# Patient Record
Sex: Male | Born: 1955 | Race: White | Hispanic: No | Smoking: Never smoker
Health system: Southern US, Community
[De-identification: ages and names within clinical notes are randomized; demographics above are authoritative.]

## PROBLEM LIST (undated history)

## (undated) DIAGNOSIS — E785 Hyperlipidemia, unspecified: Secondary | ICD-10-CM

## (undated) DIAGNOSIS — I10 Essential (primary) hypertension: Secondary | ICD-10-CM

## (undated) DIAGNOSIS — H9319 Tinnitus, unspecified ear: Secondary | ICD-10-CM

## (undated) DIAGNOSIS — K219 Gastro-esophageal reflux disease without esophagitis: Secondary | ICD-10-CM

## (undated) DIAGNOSIS — Z8489 Family history of other specified conditions: Secondary | ICD-10-CM

## (undated) DIAGNOSIS — F32A Depression, unspecified: Secondary | ICD-10-CM

## (undated) DIAGNOSIS — I214 Non-ST elevation (NSTEMI) myocardial infarction: Secondary | ICD-10-CM

## (undated) DIAGNOSIS — F329 Major depressive disorder, single episode, unspecified: Secondary | ICD-10-CM

## (undated) DIAGNOSIS — S13101A Dislocation of unspecified cervical vertebrae, initial encounter: Secondary | ICD-10-CM

## (undated) DIAGNOSIS — Z86711 Personal history of pulmonary embolism: Secondary | ICD-10-CM

## (undated) DIAGNOSIS — N400 Enlarged prostate without lower urinary tract symptoms: Secondary | ICD-10-CM

## (undated) DIAGNOSIS — M1990 Unspecified osteoarthritis, unspecified site: Secondary | ICD-10-CM

## (undated) DIAGNOSIS — G4730 Sleep apnea, unspecified: Secondary | ICD-10-CM

## (undated) DIAGNOSIS — D689 Coagulation defect, unspecified: Secondary | ICD-10-CM

## (undated) HISTORY — PX: OTHER SURGICAL HISTORY: SHX169

## (undated) HISTORY — DX: Hyperlipidemia, unspecified: E78.5

## (undated) HISTORY — DX: Personal history of pulmonary embolism: Z86.711

## (undated) HISTORY — PX: COLONOSCOPY: SHX174

## (undated) HISTORY — PX: POLYPECTOMY: SHX149

## (undated) HISTORY — PX: WISDOM TOOTH EXTRACTION: SHX21

## (undated) HISTORY — DX: Coagulation defect, unspecified: D68.9

---
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
## 2010-11-07 ENCOUNTER — Encounter: Payer: Self-pay | Admitting: Internal Medicine

## 2010-11-15 NOTE — Letter (Signed)
Summary: Pre Visit Letter Revised  [facility]  [address] [place], [place] [address]   Phone: [phone]  Fax: [phone]        11/07/2010 MRN: [id number]  [facility] [address] [place], [place]  [address]             Procedure Date:  12-11-10 10:30am           Direct Colon - Dr Marina Goodell   Welcome to the Gastroenterology Division at [facility].    You are scheduled to see a nurse for your pre-procedure visit on 11-28-10 at 4pm on the 3rd floor at [facility], [address]  We ask that you try to arrive at our office 15 minutes prior to your appointment time to allow for check-in.  Please take a minute to review the attached form.  If you answer "Yes" to one or more of the questions on the first page, we ask that you call the person listed at your earliest opportunity.  If you answer "No" to all of the questions, please complete the rest of the form and bring it to your appointment.    Your nurse visit will consist of discussing your medical and surgical history, your immediate family medical history, and your medications.   If you are unable to list all of your medications on the form, please bring the medication bottles to your appointment and we will list them.  We will need to be aware of both prescribed and over the counter drugs.  We will need to know exact dosage information as well.    Please be prepared to read and sign documents such as consent forms, a financial agreement, and acknowledgement forms.  If necessary, and with your consent, a friend or relative is welcome to sit-in on the nurse visit with you.  Please bring your insurance card so that we may make a copy of it.  If your insurance requires a referral to see a specialist, please bring your referral form from your primary care physician.  No co-pay is required for this nurse visit.     If you cannot keep your appointment, please call [phone] to cancel or reschedule prior to  your appointment date.  This allows [place] the opportunity to schedule an appointment for another patient in need of care.    Thank you for choosing [facility] for your medical needs.  We appreciate the opportunity to care for you.  Please visit [place] at our website  to learn more about our practice.  Sincerely, The Gastroenterology Division

## 2010-11-26 ENCOUNTER — Encounter (INDEPENDENT_AMBULATORY_CARE_PROVIDER_SITE_OTHER): Payer: Self-pay | Admitting: *Deleted

## 2010-11-28 ENCOUNTER — Encounter: Payer: Self-pay | Admitting: Internal Medicine

## 2010-12-05 NOTE — Miscellaneous (Signed)
Summary: [facility] Previsit/prep  Clinical Lists Changes  Medications: Added new medication of MOVIPREP 100 GM  SOLR (PEG-KCL-NACL-NASULF-NA ASC-C) As per prep instructions. - Signed Rx of MOVIPREP 100 GM  SOLR (PEG-KCL-NACL-NASULF-NA ASC-C) As per prep instructions.;  #1 x 0;  Signed;  Entered by: Wyona Almas RN;  Authorized by: Hilarie Fredrickson MD;  Method used: Electron[phone] [address] [place], [place], [place]  [address], Ph: [phone] or [phone], Fax: [phone] Observations: Added new observation of NKA: T (11/28/2010 16:00)    Prescriptions: MOVIPREP 100 GM  SOLR (PEG-KCL-NACL-NASULF-NA ASC-C) As per prep instructions.  #1 x 0   Entered by:   Wyona Almas RN   Authorized by:   Hilarie Fredrickson MD   Signed by:   Wyona Almas RN on 11/28/2010   Method used:   Electronically to        [facility] [phone][facility] (retail)       [address]       [place], [place]  [address]       Ph: [phone] or [phone]       Fax: [phone]   RxID:   [id number]

## 2010-12-05 NOTE — Letter (Signed)
Summary: [facility] Instructions  [facility]  [address] [place], [place] [address]   Phone: [phone]  Fax: [phone]       Jonathan Rios    18-Oct-1955    MRN: [id number]        Procedure Day /Date: Monday 12-17-10      Arrival Time: 3:00 pm     Procedure Time: 4:00 pm     Location of Procedure:                    _x _  [facility] (4th Floor)                        PREPARATION FOR COLONOSCOPY WITH MOVIPREP   Starting 5 days prior to your procedure  12-12-10 do not eat nuts, seeds, popcorn, corn, beans, peas,  salads, or any raw vegetables.  Do not take any fiber supplements (e.g. Metamucil, Citrucel, and Benefiber).  THE DAY BEFORE YOUR PROCEDURE         DATE:  12-16-10  DAY: Sunday   1.  Drink clear liquids the entire day-NO SOLID FOOD  2.  Do not drink anything colored red or purple.  Avoid juices with pulp.  No orange juice.  3.  Drink at least 64 oz. (8 glasses) of fluid/clear liquids during the day to prevent dehydration and help the prep work efficiently.  CLEAR LIQUIDS INCLUDE: Water Jello Ice Popsicles Tea (sugar ok, no milk/cream) Powdered fruit flavored drinks Coffee (sugar ok, no milk/cream) Gatorade Juice: apple, white grape, white cranberry  Lemonade Clear bullion, consomm, broth Carbonated beverages (any kind) Strained chicken noodle soup Hard Candy                             4.  In the morning, mix first dose of MoviPrep solution:    Empty 1 Pouch A and 1 Pouch B into the disposable container    Add lukewarm drinking water to the top line of the container. Mix to dissolve    Refrigerate (mixed solution should be used within 24 hrs)  5.  Begin drinking the prep at 5:00 p.m. The MoviPrep container is divided by 4 marks.   Every 15 minutes drink the solution down to the next mark (approximately 8 oz) until the full liter is complete.   6.  Follow completed prep with 16 oz of clear liquid of your choice  (Nothing red or purple).  Continue to drink clear liquids until bedtime.  7.  Before going to bed, mix second dose of MoviPrep solution:    Empty 1 Pouch A and 1 Pouch B into the disposable container    Add lukewarm drinking water to the top line of the container. Mix to dissolve    Refrigerate  THE DAY OF YOUR PROCEDURE      DATE:  12-17-10  DAY: Monday  Beginning at  11:00 a.m. (5 hours before procedure):         1. Every 15 minutes, drink the solution down to the next mark (approx 8 oz) until the full liter is complete.  2. Follow completed prep with 16 oz. of clear liquid of your choice.    3. You may drink clear liquids until  2:00 p.m. (2 HOURS BEFORE PROCEDURE).   MEDICATION INSTRUCTIONS  Unless otherwise instructed, you should take regular prescription medications with a small sip of water  as early as possible the morning of your procedure.       OTHER INSTRUCTIONS  You will need a responsible adult at least 54 years of age to accompany you and drive you home.   This person must remain in the waiting room during your procedure.  Wear loose fitting clothing that is easily removed.  Leave jewelry and other valuables at home.  However, you may wish to bring a book to read or  an iPod/MP3 player to listen to music as you wait for your procedure to start.  Remove all body piercing jewelry and leave at home.  Total time from sign-in until discharge is approximately 2-3 hours.  You should go home directly after your procedure and rest.  You can resume normal activities the  day after your procedure.  The day of your procedure you should not:   Drive   Make legal decisions   Operate machinery   Drink alcohol   Return to work  You will receive specific instructions about eating, activities and medications before you leave.    The above instructions have been reviewed and explained to me by  Wyona Almas RN  November 28, 2010 4:35 PM     I fully  understand and can verbalize these instructions _____________________________ Date _________

## 2010-12-11 ENCOUNTER — Other Ambulatory Visit: Payer: Self-pay | Admitting: Internal Medicine

## 2010-12-17 ENCOUNTER — Other Ambulatory Visit: Payer: Self-pay | Admitting: Internal Medicine

## 2010-12-17 ENCOUNTER — Other Ambulatory Visit (AMBULATORY_SURGERY_CENTER): Payer: Managed Care, Other (non HMO) | Admitting: Internal Medicine

## 2010-12-17 DIAGNOSIS — D126 Benign neoplasm of colon, unspecified: Secondary | ICD-10-CM

## 2010-12-17 DIAGNOSIS — Z1211 Encounter for screening for malignant neoplasm of colon: Secondary | ICD-10-CM

## 2010-12-25 ENCOUNTER — Telehealth: Payer: Self-pay | Admitting: Internal Medicine

## 2010-12-25 NOTE — Procedures (Addendum)
Summary: Colonoscopy  Patient: [name] Note: All result statuses are Final unless otherwise noted.  Tests: (1) Colonoscopy (COL)   COL Colonoscopy           DONE     [facility]     [address]     [place], [place]  [address]          COLONOSCOPY PROCEDURE REPORT          PATIENT:  Jonathan Rios, Jonathan Rios  MR#:  [id number]     BIRTHDATE:  1955-11-10, 54 yrs. old  GENDER:  male     ENDOSCOPIST:  Wilhemina Bonito. Eda Keys, MD     REF. BY:  .Direct/ Dr. Gonzella Lex     PROCEDURE DATE:  12/17/2010     PROCEDURE:  Colonoscopy with snare polypectomy x 1     ASA CLASS:  Class I     INDICATIONS:  Routine Risk Screening     MEDICATIONS:   Fentanyl 100 mcg IV, Versed 10 mg IV          DESCRIPTION OF PROCEDURE:   After the risks benefits and     alternatives of the procedure were thoroughly explained, informed     consent was obtained.  Digital rectal exam was performed and     revealed no abnormalities.   The LB 180AL [id number] endoscope was     introduced through the anus and advanced to the cecum, which was     identified by both the appendix and ileocecal valve, without     limitations.Time to cecum = 2:45 min.  The quality of the prep was     good, using MoviPrep.  The instrument was then slowly withdrawn     (time =11:41 min) as the colon was fully examined.     <<PROCEDUREIMAGES>>          FINDINGS:  A 9mm sessile polyp was found in the ascending colon.     Polyp was snared without cautery. Retrieval was successful.     Otherwise normal colonoscopy without other polyps, masses, vascular     ectasias, or inflammatory changes.   Retroflexed views in the     rectum revealed no abnormalities.    The scope was then withdrawn     from the patient and the procedure completed.          COMPLICATIONS:  None          ENDOSCOPIC IMPRESSION:     1) Sessile polyp in the ascending colon - removed     2) Otherwise normal colonoscopy          RECOMMENDATIONS:     1) Follow up  colonoscopy in 5 years          ______________________________     Wilhemina Bonito. Eda Keys, MD          CC:  The Patient; Charna Archer, MD          n.     eSIGNED:   Wilhemina Bonito. Eda Keys at 12/17/2010 05:03 PM          Mertie Clause, [id number]  Note: An exclamation mark (!) indicates a result that was not dispersed into the flowsheet. Document Creation Date: 12/17/2010 5:03 PM _______________________________________________________________________  (1) Order result status: Final Collection or observation date-time: 12/17/2010 16:55 Requested date-time:  Receipt date-time:  Reported date-time:  Referring Physician:   Ordering Physician: Fransico Setters [phone]) Specimen Source:  Source: Launa Grill Order Number: [id number]  Lab site:   Appended Document: Colonoscopy     Procedures Next Due Date:    Colonoscopy: 12/2015

## 2010-12-29 ENCOUNTER — Encounter: Payer: Self-pay | Admitting: Internal Medicine

## 2011-01-01 NOTE — Letter (Addendum)
Summary: Patient Notice- Polyp Results  [facility]  [address] [place], [place] [address]   Phone: [phone]  Fax: [phone]        December 29, 2010 MRN: [id number]    [facility] [address] [place], [place]  [address]    Dear Mr. Cali,  I am pleased to inform you that the colon polyp(s) removed during your recent colonoscopy was (were) found to be benign (no cancer detected) upon pathologic examination.  I recommend you have a repeat colonoscopy examination in 5 years to look for recurrent polyps, as having colon polyps increases your risk for having recurrent polyps or even colon cancer in the future.  Should you develop new or worsening symptoms of abdominal pain, bowel habit changes or bleeding from the rectum or bowels, please schedule an evaluation with either your primary care physician or with me.  Additional information/recommendations:  __ No further action with gastroenterology is needed at this time. Please      follow-up with your primary care physician for your other healthcare      needs.   Please call us if you are having persistent problems or have questions about your condition that have not been fully answered at this time.  Sincerely,  Hilarie Fredrickson MD  This letter has been electronically signed by your physician.  Appended Document: Patient Notice- Polyp Results letter mailed

## 2011-01-01 NOTE — Progress Notes (Signed)
Summary: speak with nurse  Phone Note Call from Patient Call back at [phone]   Caller: Patient Call For: Dr. Marina Goodell Reason for Call: Talk to Nurse Summary of Call: Patient is calling about getting a copy of his colon report, he spoke with Medical records but they told him he had to speak with Dr. Broadus John nurse to get a copy so that is what he wants to do Initial call taken by: [place] Johnson,  December 25, 2010 10:15 AM  Follow-up for Phone Call        Colon report mailed to patient today as per his request. Follow-up by: Selinda Michaels RN,  December 25, 2010 10:43 AM

## 2015-02-01 ENCOUNTER — Inpatient Hospital Stay (HOSPITAL_COMMUNITY): Payer: Managed Care, Other (non HMO)

## 2015-02-01 ENCOUNTER — Encounter (HOSPITAL_COMMUNITY): Payer: Self-pay

## 2015-02-01 ENCOUNTER — Emergency Department (HOSPITAL_COMMUNITY): Payer: Managed Care, Other (non HMO)

## 2015-02-01 ENCOUNTER — Inpatient Hospital Stay (HOSPITAL_COMMUNITY)
Admission: EM | Admit: 2015-02-01 | Discharge: 2015-02-06 | DRG: 176 | Disposition: A | Discharge status: Home / Self Care | Payer: Managed Care, Other (non HMO) | Attending: Internal Medicine | Admitting: Internal Medicine

## 2015-02-01 DIAGNOSIS — I82432 Acute embolism and thrombosis of left popliteal vein: Secondary | ICD-10-CM | POA: Diagnosis present

## 2015-02-01 DIAGNOSIS — I82491 Acute embolism and thrombosis of other specified deep vein of right lower extremity: Secondary | ICD-10-CM | POA: Diagnosis present

## 2015-02-01 DIAGNOSIS — I82812 Embolism and thrombosis of superficial veins of left lower extremities: Secondary | ICD-10-CM | POA: Diagnosis present

## 2015-02-01 DIAGNOSIS — I951 Orthostatic hypotension: Secondary | ICD-10-CM | POA: Diagnosis present

## 2015-02-01 DIAGNOSIS — Z8249 Family history of ischemic heart disease and other diseases of the circulatory system: Secondary | ICD-10-CM

## 2015-02-01 DIAGNOSIS — I2699 Other pulmonary embolism without acute cor pulmonale: Secondary | ICD-10-CM | POA: Diagnosis not present

## 2015-02-01 DIAGNOSIS — R0602 Shortness of breath: Secondary | ICD-10-CM

## 2015-02-01 DIAGNOSIS — I82403 Acute embolism and thrombosis of unspecified deep veins of lower extremity, bilateral: Secondary | ICD-10-CM | POA: Diagnosis not present

## 2015-02-01 DIAGNOSIS — I214 Non-ST elevation (NSTEMI) myocardial infarction: Secondary | ICD-10-CM

## 2015-02-01 DIAGNOSIS — F329 Major depressive disorder, single episode, unspecified: Secondary | ICD-10-CM | POA: Diagnosis present

## 2015-02-01 DIAGNOSIS — R0902 Hypoxemia: Secondary | ICD-10-CM | POA: Diagnosis present

## 2015-02-01 DIAGNOSIS — R0789 Other chest pain: Secondary | ICD-10-CM | POA: Diagnosis present

## 2015-02-01 DIAGNOSIS — I82409 Acute embolism and thrombosis of unspecified deep veins of unspecified lower extremity: Secondary | ICD-10-CM | POA: Insufficient documentation

## 2015-02-01 DIAGNOSIS — I10 Essential (primary) hypertension: Secondary | ICD-10-CM | POA: Diagnosis present

## 2015-02-01 DIAGNOSIS — R079 Chest pain, unspecified: Secondary | ICD-10-CM | POA: Diagnosis present

## 2015-02-01 DIAGNOSIS — I9589 Other hypotension: Secondary | ICD-10-CM | POA: Diagnosis not present

## 2015-02-01 HISTORY — DX: Unspecified osteoarthritis, unspecified site: M19.90

## 2015-02-01 HISTORY — DX: Sleep apnea, unspecified: G47.30

## 2015-02-01 HISTORY — DX: Non-ST elevation (NSTEMI) myocardial infarction: I21.4

## 2015-02-01 HISTORY — DX: Gastro-esophageal reflux disease without esophagitis: K21.9

## 2015-02-01 HISTORY — DX: Essential (primary) hypertension: I10

## 2015-02-01 HISTORY — DX: Depression, unspecified: F32.A

## 2015-02-01 HISTORY — DX: Dislocation of unspecified cervical vertebrae, initial encounter: S13.101A

## 2015-02-01 HISTORY — DX: Family history of other specified conditions: Z84.89

## 2015-02-01 HISTORY — DX: Tinnitus, unspecified ear: H93.19

## 2015-02-01 HISTORY — DX: Benign prostatic hyperplasia without lower urinary tract symptoms: N40.0

## 2015-02-01 HISTORY — DX: Major depressive disorder, single episode, unspecified: F32.9

## 2015-02-01 LAB — BLOOD GAS, ARTERIAL
Acid-base deficit: 6.2 mmol/L — ABNORMAL HIGH (ref 0.0–2.0)
BICARBONATE: 17 meq/L — AB (ref 20.0–24.0)
Drawn by: 36989
O2 Content: 4 L/min
O2 Saturation: 95.5 %
PATIENT TEMPERATURE: 98.6
PCO2 ART: 24.7 mmHg — AB (ref 35.0–45.0)
PH ART: 7.453 — AB (ref 7.350–7.450)
TCO2: 17.8 mmol/L (ref 0–100)
pO2, Arterial: 71.6 mmHg — ABNORMAL LOW (ref 80.0–100.0)

## 2015-02-01 LAB — TROPONIN I: TROPONIN I: 0.25 ng/mL — AB (ref <0.031)

## 2015-02-01 LAB — BASIC METABOLIC PANEL
Anion gap: 10 (ref 5–15)
BUN: 19 mg/dL (ref 6–23)
CHLORIDE: 106 mmol/L (ref 96–112)
CO2: 21 mmol/L (ref 19–32)
Calcium: 9.1 mg/dL (ref 8.4–10.5)
Creatinine, Ser: 1.21 mg/dL (ref 0.50–1.35)
GFR calc Af Amer: 75 mL/min — ABNORMAL LOW (ref >90)
GFR calc non Af Amer: 64 mL/min — ABNORMAL LOW (ref >90)
GLUCOSE: 167 mg/dL — AB (ref 70–99)
POTASSIUM: 4.8 mmol/L (ref 3.5–5.1)
Sodium: 137 mmol/L (ref 135–145)

## 2015-02-01 LAB — APTT: APTT: 28 s (ref 24–37)

## 2015-02-01 LAB — CBC
HEMATOCRIT: 50.6 % (ref 39.0–52.0)
Hemoglobin: 17.1 g/dL — ABNORMAL HIGH (ref 13.0–17.0)
MCH: 30.2 pg (ref 26.0–34.0)
MCHC: 33.8 g/dL (ref 30.0–36.0)
MCV: 89.4 fL (ref 78.0–100.0)
Platelets: 224 10*3/uL (ref 150–400)
RBC: 5.66 MIL/uL (ref 4.22–5.81)
RDW: 13.3 % (ref 11.5–15.5)
WBC: 17.3 10*3/uL — AB (ref 4.0–10.5)

## 2015-02-01 LAB — I-STAT TROPONIN, ED: TROPONIN I, POC: 0.13 ng/mL — AB (ref 0.00–0.08)

## 2015-02-01 LAB — PROTIME-INR
INR: 1.17 (ref 0.00–1.49)
Prothrombin Time: 15.1 seconds (ref 11.6–15.2)

## 2015-02-01 LAB — HEPARIN LEVEL (UNFRACTIONATED): HEPARIN UNFRACTIONATED: 0.3 [IU]/mL (ref 0.30–0.70)

## 2015-02-01 MED ORDER — ONDANSETRON HCL 4 MG/2ML IJ SOLN: 4.0000 mg | Freq: Four times a day (QID) | INTRAMUSCULAR | Status: DC | PRN

## 2015-02-01 MED ORDER — ASPIRIN EC 81 MG PO TBEC: 81.0000 mg | DELAYED_RELEASE_TABLET | Freq: Every day | ORAL | Status: DC

## 2015-02-01 MED ORDER — ASPIRIN 81 MG PO CHEW
324.0000 mg | CHEWABLE_TABLET | ORAL | Status: DC
Start: 2015-02-01 — End: 2015-02-02

## 2015-02-01 MED ORDER — SODIUM CHLORIDE 0.9 % IJ SOLN: 3.0000 mL | INTRAMUSCULAR | Status: DC | PRN

## 2015-02-01 MED ORDER — ALPRAZOLAM 0.25 MG PO TABS: 0.2500 mg | ORAL_TABLET | Freq: Two times a day (BID) | ORAL | Status: DC | PRN

## 2015-02-01 MED ORDER — ASPIRIN 81 MG PO CHEW
81.0000 mg | CHEWABLE_TABLET | ORAL | Status: AC
  Administered 2015-02-02: 81 mg via ORAL
  Filled 2015-02-01: qty 1

## 2015-02-01 MED ORDER — BUPROPION HCL 100 MG PO TABS
100.0000 mg | ORAL_TABLET | Freq: Three times a day (TID) | ORAL | Status: DC
  Administered 2015-02-02 – 2015-02-06 (×12): 100 mg via ORAL
  Filled 2015-02-01 (×18): qty 1

## 2015-02-01 MED ORDER — SODIUM CHLORIDE 0.9 % IJ SOLN
3.0000 mL | Freq: Two times a day (BID) | INTRAMUSCULAR | Status: DC
  Administered 2015-02-02 – 2015-02-05 (×5): 3 mL via INTRAVENOUS

## 2015-02-01 MED ORDER — SODIUM CHLORIDE 0.9 % IV SOLN
250.0000 mL | INTRAVENOUS | Status: DC | PRN
Start: 2015-02-01 — End: 2015-02-06

## 2015-02-01 MED ORDER — ASPIRIN 300 MG RE SUPP: 300.0000 mg | RECTAL | Status: DC

## 2015-02-01 MED ORDER — SODIUM CHLORIDE 0.9 % IV SOLN: 250.0000 mL | INTRAVENOUS | Status: DC | PRN

## 2015-02-01 MED ORDER — ASPIRIN 81 MG PO CHEW
324.0000 mg | CHEWABLE_TABLET | Freq: Once | ORAL | Status: AC
  Administered 2015-02-01: 324 mg via ORAL
  Filled 2015-02-01: qty 4

## 2015-02-01 MED ORDER — NITROGLYCERIN 0.4 MG SL SUBL
0.4000 mg | SUBLINGUAL_TABLET | SUBLINGUAL | Status: AC | PRN
  Administered 2015-02-01 (×3): 0.4 mg via SUBLINGUAL
  Filled 2015-02-01: qty 1

## 2015-02-01 MED ORDER — HEPARIN (PORCINE) IN NACL 100-0.45 UNIT/ML-% IJ SOLN
1300.0000 [IU]/h | INTRAMUSCULAR | Status: DC
  Administered 2015-02-01: 1300 [IU]/h via INTRAVENOUS
  Filled 2015-02-01: qty 250

## 2015-02-01 MED ORDER — ACETAMINOPHEN 325 MG PO TABS: 650.0000 mg | ORAL_TABLET | ORAL | Status: DC | PRN

## 2015-02-01 MED ORDER — NITROGLYCERIN 0.4 MG SL SUBL: 0.4000 mg | SUBLINGUAL_TABLET | SUBLINGUAL | Status: DC | PRN

## 2015-02-01 MED ORDER — LISINOPRIL 10 MG PO TABS: 10.0000 mg | ORAL_TABLET | Freq: Every day | ORAL | Status: DC

## 2015-02-01 MED ORDER — SODIUM CHLORIDE 0.9 % IJ SOLN: 3.0000 mL | Freq: Two times a day (BID) | INTRAMUSCULAR | Status: DC

## 2015-02-01 MED ORDER — HEPARIN BOLUS VIA INFUSION
4000.0000 [IU] | Freq: Once | INTRAVENOUS | Status: AC
  Administered 2015-02-01: 4000 [IU] via INTRAVENOUS
  Filled 2015-02-01: qty 4000

## 2015-02-01 MED ORDER — SODIUM CHLORIDE 0.9 % IV SOLN
1.0000 mL/kg/h | INTRAVENOUS | Status: DC
  Administered 2015-02-01 – 2015-02-02 (×2): 1 mL/kg/h via INTRAVENOUS

## 2015-02-01 MED ORDER — ZOLPIDEM TARTRATE 5 MG PO TABS: 5.0000 mg | ORAL_TABLET | Freq: Every evening | ORAL | Status: DC | PRN

## 2015-02-01 NOTE — ED Notes (Signed)
Pt states felt chest tightness last night prior to bed. Today has felt fatigued and dizzy.  Recently started lisinopril.  At work bp was 96 systolic.  HR at work was 105.  Pt denies cough.

## 2015-02-01 NOTE — Progress Notes (Signed)
ANTICOAGULATION CONSULT NOTE - Initial Consult  Pharmacy Consult for Heparin Indication: chest pain/ACS  No Known Allergies  Patient Measurements: Height: 6\' 2"  (188 cm) Weight: 215 lb (97.523 kg) IBW/kg (Calculated) : 82.2 Heparin Dosing Weight: actual weight  Vital Signs: Temp: 97.9 F (36.6 C) (04/20 1151) Temp Source: Oral (04/20 1151) BP: 110/75 mmHg (04/20 1357) Pulse Rate: 107 (04/20 1357)  Labs:  Recent Labs  02/01/15 1201 02/01/15 1346  HGB 17.1*  --   HCT 50.6  --   PLT 224  --   CREATININE 1.21  --   TROPONINI  --  0.25*    CrCl cannot be calculated (Unknown ideal weight.).   Medical History: Past Medical History  Diagnosis Date  . Hypertension   . Depression     Medications:  Infusions:  . heparin 1,300 Units/hr (02/01/15 1551)    Assessment: 89 yoM presenting to ED on 4/20 with chest pain and NSTEMI.  Initial troponin is elevated.  Pharmacy is consulted to dose Heparin infusion.   Baseline coags: pending collection  CBC: Hgb 17.1, Plt 224  Troponin: 0.25  SCr 1.21, CrCl ~ 77 ml/min   Goal of Therapy:  Heparin level 0.3-0.7 units/ml Monitor platelets by anticoagulation protocol: Yes   Plan:   Baseline PTT, PT/INR  Give heparin 4000 units bolus IV x 1  Start heparin IV infusion at 1300 units/hr  Heparin level 6 hours after starting  Daily heparin level and CBC  Continue to monitor H&H and platelets   Gretta Arab PharmD, BCPS Pager [phone] 02/01/2015 3:16 PM

## 2015-02-01 NOTE — ED Provider Notes (Signed)
CSN: [id number]     Arrival date & time 02/01/15  1137 History   First MD Initiated Contact with Patient 02/01/15 1327     Chief Complaint  Patient presents with  . Chest Pain    Patient is a 59 y.o. male presenting with chest pain. The history is provided by the patient and a relative.  Chest Pain Pain location:  Substernal area Pain quality: tightness   Pain radiates to:  Does not radiate Pain severity:  Moderate Onset quality:  Gradual Duration: over 12 hours. Timing:  Constant Progression:  Worsening Chronicity:  New Relieved by:  None tried Worsened by:  Nothing tried Associated symptoms: cough, near-syncope and shortness of breath   Associated symptoms: no abdominal pain, no fever and not vomiting   Pt presents with chest tightness that started last night and has continued He reports feeling SOB He reports feeling lightheaded upon standing and had episode of diaphoresis as well He recently started lisinopril for HTN He denies h/o CAD Denies sharp/pleuritic CP   Family history - negative for CAD Past Medical History  Diagnosis Date  . Hypertension   . Depression    Past Surgical History  Procedure Laterality Date  . Angioma      History reviewed. No pertinent family history. History  Substance Use Topics  . Smoking status: Never Smoker   . Smokeless tobacco: Not on file  . Alcohol Use: No    Review of Systems  Constitutional: Negative for fever.  Respiratory: Positive for cough and shortness of breath.   Cardiovascular: Positive for chest pain and near-syncope.  Gastrointestinal: Negative for vomiting and abdominal pain.  Neurological: Positive for light-headedness.  All other systems reviewed and are negative.     Allergies  Review of patient's allergies indicates no known allergies.  Home Medications   Prior to Admission medications   Medication Sig Start Date End Date Taking? Authorizing Provider  buPROPion (WELLBUTRIN) 100 MG tablet Take 100  mg by mouth 3 (three) times daily.  01/24/15  Yes Historical Provider, MD  ibuprofen (ADVIL,MOTRIN) 200 MG tablet Take 600 mg by mouth every 6 (six) hours as needed (for pain).   Yes Historical Provider, MD  lisinopril (PRINIVIL,ZESTRIL) 10 MG tablet Take 10 mg by mouth at bedtime.  01/24/15  Yes Historical Provider, MD   BP 110/75 mmHg  Pulse 107  Temp(Src) 97.9 F (36.6 C) (Oral)  Resp 21  SpO2 94% Physical Exam CONSTITUTIONAL: Well developed/well nourished HEAD: Normocephalic/atraumatic EYES: EOMI/PERRL ENMT: Mucous membranes moist NECK: supple no meningeal signs SPINE/BACK:entire spine nontender CV: S1/S2 noted, no murmurs/rubs/gallops noted LUNGS: Lungs are clear to auscultation bilaterally, no apparent distress ABDOMEN: soft, nontender, no rebound or guarding, bowel sounds noted throughout abdomen GU:no cva tenderness NEURO: Pt is awake/alert/appropriate, moves all extremitiesx4.  No facial droop.   EXTREMITIES: pulses normal/equal, full ROM, no LE edema noted SKIN: warm, color normal PSYCH: no abnormalities of mood noted, alert and oriented to situation  ED Course  Procedures  CRITICAL CARE Performed by: Sharyon Cable Total critical care time: 32 Critical care time was exclusive of separately billable procedures and treating other patients. Critical care was necessary to treat or prevent imminent or life-threatening deterioration. Critical care was time spent personally by me on the following activities: development of treatment plan with patient and/or surrogate as well as nursing, discussions with consultants, evaluation of patient's response to treatment, examination of patient, obtaining history from patient or surrogate, ordering and performing treatments and interventions, ordering and  review of laboratory studies, ordering and review of radiographic studies, pulse oximetry and re-evaluation of patient's condition. PATIENT WITH NON-STEMI THAT REQUIRES ADMISSION TO  CARDIOLOGY  Labs Review Labs Reviewed  CBC - Abnormal; Notable for the following:    WBC 17.3 (*)    Hemoglobin 17.1 (*)    All other components within normal limits  BASIC METABOLIC PANEL - Abnormal; Notable for the following:    Glucose, Bld 167 (*)    GFR calc non Af Amer 64 (*)    GFR calc Af Amer 75 (*)    All other components within normal limits  TROPONIN I - Abnormal; Notable for the following:    Troponin I 0.25 (*)    All other components within normal limits  I-STAT TROPOININ, ED - Abnormal; Notable for the following:    Troponin i, poc 0.13 (*)    All other components within normal limits  APTT  PROTIME-INR    Imaging Review Dg Chest Portable 1 View  02/01/2015   CLINICAL DATA:  Chest pain and short of breath  EXAM: PORTABLE CHEST - 1 VIEW  COMPARISON:  None.  FINDINGS: The heart size and mediastinal contours are within normal limits. Both lungs are clear. The visualized skeletal structures are unremarkable.  IMPRESSION: No active disease.   Electronically Signed   By: Franchot Gallo M.D.   On: 02/01/2015 14:30     EKG Interpretation   Date/Time:  Wednesday February 01 2015 11:47:26 EDT Ventricular Rate:  107 PR Interval:  159 QRS Duration: 88 QT Interval:  337 QTC Calculation: 450 R Axis:   98 Text Interpretation:  Sinus tachycardia Biatrial enlargement Borderline  right axis deviation No previous ECGs available Abnormal ekg Confirmed by  Christy Gentles  MD, Elenore Rota ([id number]) on 02/01/2015 12:36:39 PM     Medications  heparin bolus via infusion 4,000 Units (not administered)  heparin ADULT infusion 100 units/mL (25000 units/250 mL) (not administered)  aspirin chewable tablet 324 mg (324 mg Oral Given 02/01/15 1353)  nitroGLYCERIN (NITROSTAT) SL tablet 0.4 mg (0.4 mg Sublingual Given 02/01/15 1529)    3:41 PM PT FOUND TO HAVE NONSTEMI PT WITH CHEST TIGHTNESS AND NEAR SYNCOPE WILL ADMIT TO CARDIOLOGY D/W [facility] WILL ADMIT TO SDU AT [facility] PT DENIES RECENT  GI BLEED, HEPARIN ORDERED DOUBT PE AS CAUSE (DENIES PLEURITIC CP) BP 86/58 mmHg  Pulse 95  Temp(Src) 97.9 F (36.6 C) (Oral)  Resp 21  Ht 6\' 2"  (1.88 m)  Wt 215 lb (97.523 kg)  BMI 27.59 kg/m2  SpO2 93%   MDM   Final diagnoses:  Non-STEMI (non-ST elevated myocardial infarction)    Nursing notes including past medical history and social history reviewed and considered in documentation Labs/vital reviewed myself and considered during evaluation xrays/imaging reviewed by myself and considered during evaluation      Ripley Fraise, MD 02/01/15 1542

## 2015-02-01 NOTE — Progress Notes (Addendum)
Brief X-Cover Note  Jonathan Rios was in the bathroom when he felt dizziness and gradually slumped into the floor. He did not overtly pass out but felt very lightheaded. He has felt similarly a few times since his symptoms started overnight last night leading to presentation. He has an elevated troponin with plans for cardiac catheterization in the morning. Telemetry revealed sinus tachycardia. Repeat ECG largely unchanged with nonspecific ST changes to borderline ST depression, sinus tachycardia, incomplete RBBB. He is mildly hypotensive 80s/40s and we are giving him 1L fluid bolus and gradually transitioning him back to bed. Redraw troponins, early repeat lab draw, low threshold to activate cath lab based on any changes in symptoms. I discussed the plan with Jonathan Rios as well.   Jules Husbands, MD  Addendum: He felt improved and rested well afterwards. Troponin increased slightly to 0.49. D-Dimer returned positive but difficult to interpret in setting of NSTEMI. Wbc also increasing. I have added on a urinalysis. Anticipate heart catheterization today. If negative, may need to pursue alternative causes.   Jules Husbands, MD

## 2015-02-01 NOTE — ED Notes (Signed)
Troponin given to Dr Aline Brochure.Marland Kitchenklj

## 2015-02-01 NOTE — H&P (Signed)
CARDIOLOGY ADMISSION NOTE  Patient ID: Jonathan Rios MRN: [id number] DOB/AGE: May 18, 1956 59 y.o.  Admit date: 02/01/2015 Primary Physician   Orpah Melter, MD Primary Cardiologist   None Chief Complaint    Chest pain  HPI:  The patient presented with chest pain that started this AM.  He has no prior cardiac and very little medical history. He was recently started on medications for depression and hypertension. This morning he was in bed and he developed some chest discomfort. It was substernal. It was moderate in intensity. When he got up to walk around he was very lightheaded. 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 However, he was very fatigued and wobbly at work. He was continuing to have the discomfort. He was urged to come to the emergency room and was driven by his daughter. He has had nitroglycerin sublingual and is currently pain-free. He's on heparin and has had aspirin. He otherwise had been feeling somewhat more fatigued in the last weeks. However, he is able to do yard work. With this he's not been having any symptoms.  The patient denies any new symptoms such as chest discomfort, neck or arm discomfort. There has been no new shortness of breath, PND or orthopnea. There have been no reported palpitations, presyncope or syncope. Of note in the emergency room his EKG was nonacute. He does have elevated troponin 1 at 0.25. His WBCs are elevated. His glucose was also elevated.    Past Medical History  Diagnosis Date  . Hypertension   . Depression     Past Surgical History  Procedure Laterality Date  . Angioma       No Known Allergies   Prior to Admission medications   Medication Sig Start Date End Date Taking? Authorizing Provider  buPROPion (WELLBUTRIN) 100 MG tablet Take 100 mg by mouth 3 (three) times daily.  01/24/15  Yes Historical Provider, MD  ibuprofen (ADVIL,MOTRIN) 200 MG tablet Take 600 mg by mouth every 6 (six) hours as needed (for pain).   Yes Historical  Provider, MD  lisinopril (PRINIVIL,ZESTRIL) 10 MG tablet Take 10 mg by mouth at bedtime.  01/24/15  Yes Historical Provider, MD    History   Social History  . Marital Status: Single    Spouse Name: N/A  . Number of Children: N/A  . Years of Education: N/A   Occupational History  . Not on file.   Social History Main Topics  . Smoking status: Never Smoker   . Smokeless tobacco: Not on file  . Alcohol Use: No  . Drug Use: No  . Sexual Activity: Not on file   Other Topics Concern  . Not on file   Social History Narrative  . No narrative on file    History reviewed. No pertinent family history.   ROS:  As stated in the HPI and negative for all other systems.  Physical Exam: Blood pressure 106/84, pulse 102, temperature 97.9 F (36.6 C), temperature source Oral, resp. rate 25, height 6\' 2"  (1.88 m), weight 215 lb (97.523 kg), SpO2 92 %.  GENERAL:  Well appearing HEENT:  Pupils equal round and reactive, fundi not visualized, oral mucosa unremarkable NECK:  No jugular venous distention, waveform within normal limits, carotid upstroke brisk and symmetric, no bruits, no thyromegaly LYMPHATICS:  No cervical, inguinal adenopathy LUNGS:  Clear to auscultation bilaterally BACK:  No CVA tenderness CHEST:  Unremarkable HEART:  PMI not displaced or sustained,S1 and S2 within normal limits, no S3, no S4, no  clicks, no rubs, no murmurs ABD:  Flat, positive bowel sounds normal in frequency in pitch, no bruits, no rebound, no guarding, no midline pulsatile mass, no hepatomegaly, no splenomegaly EXT:  2 plus pulses throughout, no edema, no cyanosis no clubbing SKIN:  No rashes no nodules NEURO:  Cranial nerves II through XII grossly intact, motor grossly intact throughout PSYCH:  Cognitively intact, oriented to person place and time  Labs: Lab Results  Component Value Date   BUN 19 02/01/2015   Lab Results  Component Value Date   CREATININE 1.21 02/01/2015   Lab Results  Component  Value Date   NA 137 02/01/2015   K 4.8 02/01/2015   CL 106 02/01/2015   CO2 21 02/01/2015   Lab Results  Component Value Date   TROPONINI 0.25* 02/01/2015   Lab Results  Component Value Date   WBC 17.3* 02/01/2015   HGB 17.1* 02/01/2015   HCT 50.6 02/01/2015   MCV 89.4 02/01/2015   PLT 224 02/01/2015     Radiology:  CXR:  The heart size and mediastinal contours are within normal limits. Both lungs are clear. The visualized skeletal structures are unremarkable.  EKG:  Sinus tachycardia, right axis deviation, no acute ST-T wave changes.   ASSESSMENT AND PLAN:    NQWMI:  [facility]  He will have a cath. The patient understands that risks included but are not limited to stroke (1 in 1000), death (1 in 20), kidney failure [usually temporary] (1 in 500), bleeding (1 in 200), allergic reaction [possibly serious] (1 in 200).  The patient understands and agrees to proceed.   He will be treated with a low-dose beta blocker, heparin and aspirin.  Although he does not have significant cardiovascular risk factors I'm not suspecting another etiology. I don't have any suspicion for aortic dissection and he has no significant risk for pulmonary emboli.  HTN:  Given his low blood pressure I would hold the lisinopril. I would initiate a very low dose of beta blocker. We can use 12.5 mg metoprolol twice a day.  ELEVATED BS:  Check a hemoglobin A1c.:      RISK REDUCTION:  Check A1C  Signed: Minus Breeding 02/01/2015, 5:15 PM

## 2015-02-02 ENCOUNTER — Inpatient Hospital Stay (HOSPITAL_COMMUNITY): Payer: Managed Care, Other (non HMO)

## 2015-02-02 DIAGNOSIS — I9589 Other hypotension: Secondary | ICD-10-CM

## 2015-02-02 DIAGNOSIS — R0602 Shortness of breath: Secondary | ICD-10-CM

## 2015-02-02 DIAGNOSIS — R0789 Other chest pain: Secondary | ICD-10-CM

## 2015-02-02 DIAGNOSIS — I2699 Other pulmonary embolism without acute cor pulmonale: Secondary | ICD-10-CM

## 2015-02-02 LAB — COMPREHENSIVE METABOLIC PANEL
ALT: 22 U/L (ref 0–53)
AST: 27 U/L (ref 0–37)
Albumin: 3.3 g/dL — ABNORMAL LOW (ref 3.5–5.2)
Alkaline Phosphatase: 73 U/L (ref 39–117)
Anion gap: 11 (ref 5–15)
BUN: 18 mg/dL (ref 6–23)
CALCIUM: 8.5 mg/dL (ref 8.4–10.5)
CHLORIDE: 104 mmol/L (ref 96–112)
CO2: 23 mmol/L (ref 19–32)
Creatinine, Ser: 1.4 mg/dL — ABNORMAL HIGH (ref 0.50–1.35)
GFR calc non Af Amer: 54 mL/min — ABNORMAL LOW (ref >90)
GFR, EST AFRICAN AMERICAN: 63 mL/min — AB (ref >90)
GLUCOSE: 191 mg/dL — AB (ref 70–99)
POTASSIUM: 5 mmol/L (ref 3.5–5.1)
Sodium: 138 mmol/L (ref 135–145)
TOTAL PROTEIN: 6.4 g/dL (ref 6.0–8.3)
Total Bilirubin: 1.7 mg/dL — ABNORMAL HIGH (ref 0.3–1.2)

## 2015-02-02 LAB — CBC
HCT: 42.5 % (ref 39.0–52.0)
HCT: 41.2 % (ref 39.0–52.0)
Hemoglobin: 14.3 g/dL (ref 13.0–17.0)
Hemoglobin: 13.9 g/dL (ref 13.0–17.0)
MCH: 30 pg (ref 26.0–34.0)
MCH: 29.8 pg (ref 26.0–34.0)
MCHC: 33.6 g/dL (ref 30.0–36.0)
MCHC: 33.7 g/dL (ref 30.0–36.0)
MCV: 89.1 fL (ref 78.0–100.0)
MCV: 88.2 fL (ref 78.0–100.0)
PLATELETS: 198 10*3/uL (ref 150–400)
PLATELETS: 180 10*3/uL (ref 150–400)
RBC: 4.77 MIL/uL (ref 4.22–5.81)
RBC: 4.67 MIL/uL (ref 4.22–5.81)
RDW: 14.2 % (ref 11.5–15.5)
RDW: 14 % (ref 11.5–15.5)
WBC: 14.2 10*3/uL — ABNORMAL HIGH (ref 4.0–10.5)
WBC: 12.7 10*3/uL — ABNORMAL HIGH (ref 4.0–10.5)

## 2015-02-02 LAB — D-DIMER, QUANTITATIVE: D-Dimer, Quant: >20 ug/mL-FEU — ABNORMAL HIGH (ref 0.00–0.48)

## 2015-02-02 LAB — CBC WITH DIFFERENTIAL/PLATELET
Basophils Absolute: 0 10*3/uL (ref 0.0–0.1)
Basophils Relative: 0 % (ref 0–1)
EOS ABS: 0 10*3/uL (ref 0.0–0.7)
Eosinophils Relative: 0 % (ref 0–5)
HEMATOCRIT: 45.1 % (ref 39.0–52.0)
Hemoglobin: 15 g/dL (ref 13.0–17.0)
LYMPHS ABS: 1.5 10*3/uL (ref 0.7–4.0)
Lymphocytes Relative: 6 % — ABNORMAL LOW (ref 12–46)
MCH: 29.6 pg (ref 26.0–34.0)
MCHC: 33.3 g/dL (ref 30.0–36.0)
MCV: 89.1 fL (ref 78.0–100.0)
Monocytes Absolute: 1.3 10*3/uL — ABNORMAL HIGH (ref 0.1–1.0)
Monocytes Relative: 5 % (ref 3–12)
NEUTROS PCT: 89 % — AB (ref 43–77)
Neutro Abs: 22.7 10*3/uL — ABNORMAL HIGH (ref 1.7–7.7)
Platelets: 215 10*3/uL (ref 150–400)
RBC: 5.06 MIL/uL (ref 4.22–5.81)
RDW: 13.6 % (ref 11.5–15.5)
WBC: 25.5 10*3/uL — AB (ref 4.0–10.5)

## 2015-02-02 LAB — TROPONIN I: Troponin I: 0.42 ng/mL — ABNORMAL HIGH (ref <0.031)

## 2015-02-02 LAB — FIBRINOGEN
FIBRINOGEN: 331 mg/dL (ref 204–475)
Fibrinogen: 358 mg/dL (ref 204–475)

## 2015-02-02 LAB — HEPARIN LEVEL (UNFRACTIONATED)
HEPARIN UNFRACTIONATED: 0.25 [IU]/mL — AB (ref 0.30–0.70)
HEPARIN UNFRACTIONATED: 0.41 [IU]/mL (ref 0.30–0.70)
Heparin Unfractionated: 0.41 IU/mL (ref 0.30–0.70)

## 2015-02-02 LAB — LACTIC ACID, PLASMA: Lactic Acid, Venous: 2.2 mmol/L (ref 0.5–2.0)

## 2015-02-02 LAB — MRSA PCR SCREENING: MRSA by PCR: NEGATIVE

## 2015-02-02 MED ORDER — MIDAZOLAM HCL 2 MG/2ML IJ SOLN
INTRAMUSCULAR | Status: AC
  Filled 2015-02-02: qty 2

## 2015-02-02 MED ORDER — IOHEXOL 300 MG/ML  SOLN
50.0000 mL | Freq: Once | INTRAMUSCULAR | Status: AC | PRN
  Administered 2015-02-02: 40 mL via INTRAVENOUS

## 2015-02-02 MED ORDER — SODIUM CHLORIDE 0.9 % IV SOLN
INTRAVENOUS | Status: DC
  Administered 2015-02-03: 03:00:00 via INTRAVENOUS

## 2015-02-02 MED ORDER — SODIUM CHLORIDE 0.9 % IV SOLN
INTRAVENOUS | Status: DC
  Administered 2015-02-03: 02:00:00 via INTRAVENOUS

## 2015-02-02 MED ORDER — IOHEXOL 350 MG/ML SOLN
100.0000 mL | Freq: Once | INTRAVENOUS | Status: AC | PRN
  Administered 2015-02-02: 100 mL via INTRAVENOUS

## 2015-02-02 MED ORDER — SODIUM CHLORIDE 0.9 % IV SOLN
INTRAVENOUS | Status: DC
  Administered 2015-02-02: 14:00:00 via INTRAVENOUS

## 2015-02-02 MED ORDER — SODIUM CHLORIDE 0.9 % IV SOLN
12.0000 mg | Freq: Once | INTRAVENOUS | Status: AC
  Administered 2015-02-02 (×2): 12 mg via INTRAVENOUS
  Filled 2015-02-02: qty 12

## 2015-02-02 MED ORDER — SODIUM CHLORIDE 0.9 % IJ SOLN
3.0000 mL | Freq: Two times a day (BID) | INTRAMUSCULAR | Status: DC
  Administered 2015-02-02 – 2015-02-06 (×5): 3 mL via INTRAVENOUS

## 2015-02-02 MED ORDER — FENTANYL CITRATE (PF) 100 MCG/2ML IJ SOLN
INTRAMUSCULAR | Status: AC | PRN
  Administered 2015-02-02: 50 ug via INTRAVENOUS
  Administered 2015-02-02: 25 ug via INTRAVENOUS

## 2015-02-02 MED ORDER — PANTOPRAZOLE SODIUM 40 MG PO TBEC
40.0000 mg | DELAYED_RELEASE_TABLET | Freq: Every day | ORAL | Status: DC
  Administered 2015-02-02 – 2015-02-06 (×5): 40 mg via ORAL
  Filled 2015-02-02 (×5): qty 1

## 2015-02-02 MED ORDER — SODIUM CHLORIDE 0.9 % IV SOLN: 250.0000 mL | INTRAVENOUS | Status: DC | PRN

## 2015-02-02 MED ORDER — MIDAZOLAM HCL 2 MG/2ML IJ SOLN
INTRAMUSCULAR | Status: AC | PRN
  Administered 2015-02-02 (×2): 1 mg via INTRAVENOUS

## 2015-02-02 MED ORDER — SODIUM CHLORIDE 0.9 % IV SOLN
12.0000 mg | Freq: Once | INTRAVENOUS | Status: AC
  Administered 2015-02-02: 12 mg via INTRAVENOUS
  Filled 2015-02-02: qty 12

## 2015-02-02 MED ORDER — SODIUM CHLORIDE 0.9 % IJ SOLN: 3.0000 mL | INTRAMUSCULAR | Status: DC | PRN

## 2015-02-02 MED ORDER — LIDOCAINE HCL 1 % IJ SOLN
INTRAMUSCULAR | Status: AC
  Filled 2015-02-02: qty 20

## 2015-02-02 MED ORDER — FENTANYL CITRATE (PF) 100 MCG/2ML IJ SOLN
INTRAMUSCULAR | Status: AC
  Filled 2015-02-02: qty 2

## 2015-02-02 MED ORDER — HEPARIN (PORCINE) IN NACL 100-0.45 UNIT/ML-% IJ SOLN
1700.0000 [IU]/h | INTRAMUSCULAR | Status: DC
  Administered 2015-02-02: 1400 [IU]/h via INTRAVENOUS
  Administered 2015-02-03: 1700 [IU]/h via INTRAVENOUS
  Filled 2015-02-02 (×3): qty 250

## 2015-02-02 NOTE — Progress Notes (Signed)
CRITICAL VALUE ALERT  Critical value received: Lactic Acid 2.2  Date of notification:  02/06/2015  Time of notification:  7619  Critical value read back:Yes.    Nurse who received alert:  Berniece Salines  MD notified (1st page):  Dr. Chase Caller, also notified IR as pt is still there  Time of first page:  43  MD notified (2nd page):  Time of second page:  Responding MD:  Dr. Ann Lions Time MD responded:  [phone]

## 2015-02-02 NOTE — Sedation Documentation (Signed)
Patient denies pain and is resting comfortably.  

## 2015-02-02 NOTE — Progress Notes (Signed)
Pt found laying on the floor by another RN in the bathroom.  *Before fall - pt alert & oriented x 4, independent with mobility with standby assist. Before incident patient was educated to call for assistance before getting out of bed for any reason and verbalized agreement and understanding. *Upon arrival after fall, Pt alert/oriented x4 with c/o SOB and feeling dizzy. Pt pale and clammy. Lungs clear to auscultation. Pt deny pain at any location and no wounds found upon assessment. VS taken - pt afebrile with low pressure of 89/69 and heartrate of 120, O2 sats at 88%. Oxygen via nasal cannula applied at a rate of 4L/min with improvement of O2 sats to 94%. Rapid Response RN on floor 3W at time of incident and assisted this RN by ordering portable chest xray, EKG and bolus of NS 500cc. MD Claiborne Billings notified and came to see patient. New order received for ABG, d.dimer, stat troponin, CBC & BMP and additional bolus of 500cc NS.  *Pt assisted back to bed using gait belt and wheelchair d/t continued dizziness and orthostasis, resting comfortably. Yellow socks, and yellow armband placed on patient, bed alarm turned on, family encouraged to stay at the bedside. Will continue to monitor closely.

## 2015-02-02 NOTE — Progress Notes (Signed)
ANTICOAGULATION CONSULT NOTE - Follow up Pharmacy Consult for Heparin Indication: pulmonary embolus  No Known Allergies  Patient Measurements: Height: 6\' 2"  (188 cm) Weight: 221 lb 5.5 oz (100.4 kg) IBW/kg (Calculated) : 82.2 Heparin Dosing Weight: actual weight 97.5 kg  Vital Signs: Temp: 98.5 F (36.9 C) (04/21 1625) Temp Source: Oral (04/21 1625) BP: 136/89 mmHg (04/21 1800) Pulse Rate: 98 (04/21 1800)  Labs:  Recent Labs  02/01/15 1201 02/01/15 1346 02/01/15 1532  02/01/15 2259 02/02/15 0015 02/02/15 0936 02/02/15 1254 02/02/15 1700 02/02/15 1854  HGB 17.1*  --   --   --   --  15.0  --  14.3  --  13.9  HCT 50.6  --   --   --   --  45.1  --  42.5  --  41.2  PLT 224  --   --   --   --  215  --  198  --  180  APTT  --   --  28  --   --   --   --   --   --   --   LABPROT  --   --  15.1  --   --   --   --   --   --   --   INR  --   --  1.17  --   --   --   --   --   --   --   HEPARINUNFRC  --   --   --   < > 0.30  --  0.25*  --  0.41  --   CREATININE 1.21  --   --   --   --  1.40*  --   --   --   --   TROPONINI  --  0.25*  --   --   --  0.42*  --   --   --   --   < > = values in this interval not displayed.  Estimated Creatinine Clearance: 72.8 mL/min (by C-G formula based on Cr of 1.4).   Assessment: 59 year old male with large saddle PE with plans for PE lysis procedure today First heparin level therapeutic at 0.30, second level slightly low at 0.25 No bleeding noted  Pt underwent EKOS procedure in which heparin was not stopped per RN. Checking q6h heparin levels. The first HL is therapeutic at 0.41 on heparin 1700 units/hr. No issues with the infusion or bleeding are noted.  Goal of Therapy:  Heparin level 0.3-0.7 units/ml Monitor platelets by anticoagulation protocol: Yes   Plan:  Continue heparin 1700 units/hr Q6h HL Daily HL/CBC Monitor s/sx of bleeding  Andrey Cota. Diona Foley, PharmD Clinical Pharmacist Pager [phone] 02/02/2015 8:07 PM

## 2015-02-02 NOTE — Care Management Note (Addendum)
    Page 1 of 1   02/06/2015     11:33:23 AM CARE MANAGEMENT NOTE 02/06/2015  Patient:  [facility]   Account Number:  [id number]  Date Initiated:  02/02/2015  Documentation initiated by:  Elissa Hefty  Subjective/Objective Assessment:   adm w nstemi     Action/Plan:   lives w fam, pcp dr Christella Noa   Anticipated DC Date:  02/06/2015   Anticipated DC Plan:  HOME/SELF CARE         Choice offered to / List presented to:             Status of service:  Completed, signed off Medicare Important Message given?  NO (If response is "NO", the following Medicare IM given date fields will be blank) Date Medicare IM given:   Medicare IM given by:   Date Additional Medicare IM given:   Additional Medicare IM given by:    Discharge Disposition:  HOME/SELF CARE  Per UR Regulation:  Reviewed for med. necessity/level of care/duration of stay  If discussed at [place] of Stay Meetings, dates discussed:    Comments:  Contact:  Gradel,Jill Daughter   [phone]                 [facility] Spouse [phone]  02/06/15- 1115- Marvetta Gibbons RN, BSN [phone] Pt for d/c today on Xarelto- spoke with pt at bedside- per pt he has drug coverage with [facility]- has 30 day free card and was also given the co-pay assist card- explained pt pt how to use cards- pt will f/u with pharmacy on what his copay cost will be after 30 day free should be $0 with copay assist card- offered to do benefit check with pt's new drug insurance card- pt declined- and states that he will have the pharmacy do it.  MD will provide 2 scripts on for 30 day free and one with refills to use after.   02-03-15 - [place] [phone] Recieved consult to check on patient benefits for Xaralto. Sent to [place].  Patient in IR for procedure at this time. Found current insurance listed - [employer] [place] is no longer effective.  Talked with patient who has now returned to room from procedure.  Remembers insurance did  change - he has a card in wallet which is at home but states daughter coming over later and can bring in when she comes. Notified daughter Sharee Pimple, will bring in card but will be after she gets off of work.  Instructed to bring to admitting department to get this into system when she comes in. Agreeable to do this.   patient was working and independent prior to admission.

## 2015-02-02 NOTE — Progress Notes (Signed)
MD Claiborne Billings notified of positive lab result for d.dimer. Patient VSS, no c/o pain or SOB at this time. Will continue to monitor closely.

## 2015-02-02 NOTE — Progress Notes (Signed)
ANTICOAGULATION CONSULT NOTE - Follow Up Consult  Pharmacy Consult for heparin Indication: pulmonary embolus w/ EKOS  Labs:  Recent Labs  02/01/15 1201 02/01/15 1346 02/01/15 1532  02/02/15 0015 02/02/15 0936 02/02/15 1254 02/02/15 1700 02/02/15 1854 02/02/15 2300  HGB 17.1*  --   --   --  15.0  --  14.3  --  13.9  --   HCT 50.6  --   --   --  45.1  --  42.5  --  41.2  --   PLT 224  --   --   --  215  --  198  --  180  --   APTT  --   --  28  --   --   --   --   --   --   --   LABPROT  --   --  15.1  --   --   --   --   --   --   --   INR  --   --  1.17  --   --   --   --   --   --   --   HEPARINUNFRC  --   --   --   < >  --  0.25*  --  0.41  --  0.41  CREATININE 1.21  --   --   --  1.40*  --   --   --   --   --   TROPONINI  --  0.25*  --   --  0.42*  --   --   --   --   --   < > = values in this interval not displayed.    Assessment/Plan:  59yo male remains therapeutic on heparin with EKOS. Will continue gtt at current rate and monitor Q6H levels; will likely hold heparin at end of tPA administration in preparation to go back to IR.   Wynona Neat, PharmD, BCPS  02/02/2015,11:44 PM

## 2015-02-02 NOTE — Progress Notes (Deleted)
Echocardiogram 2D Echocardiogram has been performed.  Joelene Millin 02/02/2015, 2:54 PM

## 2015-02-02 NOTE — Consult Note (Signed)
Chief Complaint: Chief Complaint  Patient presents with  . Chest Pain    Referring Physician(s): Ramasawmy  History of Present Illness: Jonathan Rios is a 59 y.o. male with a acute submassive bilateral PE and significant RT heart Strain by CTA.  Presented with SOB, chest pain, tachycardia, and lightheadeness.  Called to see regarding PE lysis.    Past Medical History  Diagnosis Date  . Hypertension   . Depression   . Family history of adverse reaction to anesthesia     "Mom gets PONV; oldest daughter is not as sensitive to it as dosage would suggest;she  takes more than want they would expect"  . Non Q wave myocardial infarction 02/01/2015  . Sleep apnea     "not dx'd; family says I have it" (02/01/2015)  . GERD (gastroesophageal reflux disease)     hx  . Arthritis     "probably a little; finger joints" (02/01/2015)  . Dislocated cervical vertebra   . Tinnitus   . BPH (benign prostatic hypertrophy)     "treated for it and it got somewhat better"    Past Surgical History  Procedure Laterality Date  . Lymphangioma excision Right 1966    axilla    Allergies: Review of patient's allergies indicates no known allergies.  Medications: Prior to Admission medications   Medication Sig Start Date End Date Taking? Authorizing Provider  buPROPion (WELLBUTRIN) 100 MG tablet Take 100 mg by mouth 3 (three) times daily.  01/24/15  Yes Historical Provider, MD  ibuprofen (ADVIL,MOTRIN) 200 MG tablet Take 600 mg by mouth every 6 (six) hours as needed (for pain).   Yes Historical Provider, MD  lisinopril (PRINIVIL,ZESTRIL) 10 MG tablet Take 10 mg by mouth at bedtime.  01/24/15  Yes Historical Provider, MD     Family History  Problem Relation Age of Onset  . Hypertension Mother     History   Social History  . Marital Status: Married    Spouse Name: N/A  . Number of Children: 3  . Years of Education: N/A   Social History Main Topics  . Smoking status: Never Smoker   .  Smokeless tobacco: Never Used  . Alcohol Use: No  . Drug Use: No  . Sexual Activity: No   Other Topics Concern  . None   Social History Narrative   Lives with daughter.  Works at [employer].       Review of Systems: A 12 point ROS discussed and pertinent positives are indicated in the HPI above.  All other systems are negative.  Review of Systems  Constitutional: Positive for diaphoresis and fatigue. Negative for fever and chills.  Respiratory: Positive for chest tightness and shortness of breath.   Cardiovascular: Positive for chest pain and palpitations.  Gastrointestinal: Negative for abdominal distention.  Genitourinary: Negative for hematuria and flank pain.    Vital Signs: BP 125/78 mmHg  Pulse 103  Temp(Src) 98.3 F (36.8 C) (Oral)  Resp 26  Ht 6\' 2"  (1.88 m)  Wt 220 lb (99.791 kg)  BMI 28.23 kg/m2  SpO2 93%  Physical Exam  Constitutional: He is oriented to person, place, and time. He appears well-developed and well-nourished. No distress.  Cardiovascular: Regular rhythm and normal heart sounds.   Tachycardic   Pulmonary/Chest: He is in respiratory distress.  Labored breathing in bed  Abdominal: Soft. He exhibits no distension.  Neurological: He is alert and oriented to person, place, and time.  Skin: Skin is warm  and dry. He is not diaphoretic.  Psychiatric: He has a normal mood and affect. His behavior is normal. Judgment and thought content normal.    Imaging: Ct Angio Chest Pe W/cm &/or Wo Cm  02/02/2015   CLINICAL DATA:  Chest tightness and discomfort. Dizziness and fatigue. Two syncopal episodes.  EXAM: CT ANGIOGRAPHY CHEST WITH CONTRAST  TECHNIQUE: Multidetector CT imaging of the chest was performed using the standard protocol during bolus administration of intravenous contrast. Multiplanar CT image reconstructions and MIPs were obtained to evaluate the vascular anatomy.  CONTRAST:  138mL OMNIPAQUE IOHEXOL 350 MG/ML SOLN  COMPARISON:  Chest  radiograph 02/01/2015.  FINDINGS: Mediastinum: Massive BILATERAL lobar pulmonary emboli. Saddle embolus straddles the origins of the RIGHT and LEFT pulmonary arteries across the midline as seen on image 43 series 4. Heart size is increased. Significant RIGHT heart strain with RV/LV ratio of 2.0 (51/25.5 mm ratio). No pericardial fluid, thickening or calcification. No acute abnormality of the thoracic aorta or other great vessels of the mediastinum. No pathologically enlarged mediastinal or hilar lymph nodes. The esophagus is normal in appearance.  Lungs/Pleura: No consolidative airspace disease. No pleural effusions. No suspicious appearing pulmonary nodules or masses.  Upper Abdomen: Visualized portions of the upper abdomen are unremarkable.  Musculoskeletal: No aggressive appearing lytic or blastic lesions are noted in the visualized portions of the skeleton.  Review of the MIP images confirms the above findings.  IMPRESSION: Positive for acute PE with CT evidence of right heart strain (RV/LV Ratio = 2.0) consistent with at least submassive (intermediate risk) PE. The presence of right heart strain has been associated with an increased risk of morbidity and mortality. Please activate Code PE by paging [phone]. Findings discussed with ordering provider.   Electronically Signed   By: Rolla Flatten M.D.   On: 02/02/2015 08:27   Dg Chest Port 1 View  02/01/2015   CLINICAL DATA:  Shortness of breath and lightheadedness  EXAM: PORTABLE CHEST - 1 VIEW  COMPARISON:  None.  FINDINGS: Normal heart size and mediastinal contours. No acute infiltrate or edema. No effusion or pneumothorax. No acute osseous findings.  IMPRESSION: Negative portable chest.   Electronically Signed   By: Monte Fantasia M.D.   On: 02/01/2015 22:00   Dg Chest Portable 1 View  02/01/2015   CLINICAL DATA:  Chest pain and short of breath  EXAM: PORTABLE CHEST - 1 VIEW  COMPARISON:  None.  FINDINGS: The heart size and mediastinal contours are  within normal limits. Both lungs are clear. The visualized skeletal structures are unremarkable.  IMPRESSION: No active disease.   Electronically Signed   By: Franchot Gallo M.D.   On: 02/01/2015 14:30    Labs:  CBC:  Recent Labs  02/01/15 1201 02/02/15 0015  WBC 17.3* 25.5*  HGB 17.1* 15.0  HCT 50.6 45.1  PLT 224 215    COAGS:  Recent Labs  02/01/15 1532  INR 1.17  APTT 28    BMP:  Recent Labs  02/01/15 1201 02/02/15 0015  NA 137 138  K 4.8 5.0  CL 106 104  CO2 21 23  GLUCOSE 167* 191*  BUN 19 18  CALCIUM 9.1 8.5  CREATININE 1.21 1.40*  GFRNONAA 64* 54*  GFRAA 75* 63*    LIVER FUNCTION TESTS:  Recent Labs  02/02/15 0015  BILITOT 1.7*  AST 27  ALT 22  ALKPHOS 73  PROT 6.4  ALBUMIN 3.3*     Assessment and Plan:  Acute submassive PE  with RT heart strain. PE lysis procedure reviewed, including risks, benefits, and alternatives.  All questions addressed. Consent obtained.  Plan for procedure today.  Thank you for this interesting consult.  I greatly enjoyed meeting Jonathan Rios and look forward to participating in their care.  SignedGreggory Keen 02/02/2015, 10:33 AM   I spent a total of 40 Minutes  in face to face in clinical consultation, greater than 50% of which was counseling/coordinating care for this patient with acute PE.

## 2015-02-02 NOTE — Sedation Documentation (Signed)
Patient is resting comfortably. 

## 2015-02-02 NOTE — Progress Notes (Signed)
ANTICOAGULATION CONSULT NOTE - Follow up Pharmacy Consult for Heparin Indication: chest pain/ACS  No Known Allergies  Patient Measurements: Height: 6\' 2"  (188 cm) Weight: 215 lb (97.523 kg) IBW/kg (Calculated) : 82.2 Heparin Dosing Weight: actual weight 97.5 kg  Vital Signs: Temp: 98.6 F (37 C) (04/21 0016) Temp Source: Oral (04/21 0016) BP: 94/58 mmHg (04/21 0016) Pulse Rate: 109 (04/21 0016)  Labs:  Recent Labs  02/01/15 1201 02/01/15 1346 02/01/15 1532 02/01/15 1548 02/01/15 2259 02/02/15 0015  HGB 17.1*  --   --   --   --  15.0  HCT 50.6  --   --   --   --  45.1  PLT 224  --   --   --   --  215  APTT  --   --  28  --   --   --   LABPROT  --   --  15.1  --   --   --   INR  --   --  1.17  --   --   --   HEPARINUNFRC  --   --   --  <0.10* 0.30  --   CREATININE 1.21  --   --   --   --   --   TROPONINI  --  0.25*  --   --   --   --     Estimated Creatinine Clearance: 77.4 mL/min (by C-G formula based on Cr of 1.21).   Medical History: Past Medical History  Diagnosis Date  . Hypertension   . Depression   . Family history of adverse reaction to anesthesia     "Mom gets PONV; oldest daughter is not as sensitive to it as dosage would suggest;she  takes more than want they would expect"  . Non Q wave myocardial infarction 02/01/2015  . Sleep apnea     "not dx'd; family says I have it" (02/01/2015)  . GERD (gastroesophageal reflux disease)     hx  . Arthritis     "probably a little; finger joints" (02/01/2015)  . Dislocated cervical vertebra   . Tinnitus   . BPH (benign prostatic hypertrophy)     "treated for it and it got somewhat better"    Medications:  Infusions:  . sodium chloride 1 mL/kg/hr (02/01/15 2324)  . heparin 1,300 Units/hr (02/01/15 1551)    Assessment: Initial 6 hour heparin level = 0.3, therapeutic on 1300 units/hr in this 59 yo male on IV heparin drip for chest pain and NSTEMI.  Initial troponin was elevated.  Heparin level is at low end  of therapeutic range. May reflect some of bolus, thus will increase rate slightly to try to prevent decrease in level. No bleeding noted per EPIC charting or per RN's report.   Goal of Therapy:  Heparin level 0.3-0.7 units/ml Monitor platelets by anticoagulation protocol: Yes   Plan:   Increase heparin IV drip rate to 1400 units/hr  Heparin level in ~ 6 hours   Daily heparin level and CBC  Continue to monitor H&H and platelets  Nicole Cella, RPh Clinical Pharmacist Pager: [phone] 02/02/2015 1:27 AM

## 2015-02-02 NOTE — Progress Notes (Signed)
F/U:  Pt awake, oriented times 3. Offers no c/o CP and states his SOB has "gotten a little better".  RR 21  With O2 sats 93% on 4l [place].  96/68  106 ST on monitor.   Troponin and d-dimer pending. Will continue to follow as neede.

## 2015-02-02 NOTE — Sedation Documentation (Signed)
Patient slightly uncomfortable

## 2015-02-02 NOTE — Procedures (Signed)
Successful insertion of bilateral EKOS [place] assisted infusion catheters for PE lysis No comp Stable Initial Left PA pressure 45/10(24) Initiate 12 hour infusion protocol ( total dose 24mg ) F/u PA pressures tomorrow Full report in pacs

## 2015-02-02 NOTE — Progress Notes (Signed)
F/U  Dr Claiborne Billings aware of positive d-dimer >20.  Pt awak, denies SOB and CP at this time.  102/72   102 ST,  20 with O2 sats 97% on 4l [place]. Will continue to follow as needed.

## 2015-02-02 NOTE — Progress Notes (Signed)
Subjective:  Notes reviewed.  D Dimer >20 Mild SOB Mild CP all day yesterday   Objective:  Vital Signs in the last 24 hours: Temp:  [97.5 F (36.4 C)-99.5 F (37.5 C)] 98.3 F (36.8 C) (04/21 0818) Pulse Rate:  [95-110] 102 (04/21 0818) Resp:  [13-27] 26 (04/21 0818) BP: (77-131)/(40-87) 96/79 mmHg (04/21 0818) SpO2:  [88 %-97 %] 97 % (04/21 0818) Weight:  [215 lb (97.523 kg)-220 lb (99.791 kg)] 220 lb (99.791 kg) (04/21 0400)  Intake/Output from previous day: 04/20 0701 - 04/21 0700 In: 1618 [I.V.:618] Out: 275 [Urine:275]   Physical Exam: General: Well developed, well nourished, mild anxious. Head:  Normocephalic and atraumatic. Lungs: Clear to auscultation and percussion. Heart: Normal S1 and accentuated P2.  No murmur, rubs or gallops.  Abdomen: soft, non-tender, positive bowel sounds. Extremities: No clubbing or cyanosis. No edema. Neurologic: Alert and oriented x 3.    Lab Results:  Recent Labs  02/01/15 1201 02/02/15 0015  WBC 17.3* 25.5*  HGB 17.1* 15.0  PLT 224 215    Recent Labs  02/01/15 1201 02/02/15 0015  NA 137 138  K 4.8 5.0  CL 106 104  CO2 21 23  GLUCOSE 167* 191*  BUN 19 18  CREATININE 1.21 1.40*    Recent Labs  02/01/15 1346 02/02/15 0015  TROPONINI 0.25* 0.42*   Hepatic Function Panel  Recent Labs  02/02/15 0015  PROT 6.4  ALBUMIN 3.3*  AST 27  ALT 22  ALKPHOS 73  BILITOT 1.7*   No results for input(s): CHOL in the last 72 hours. No results for input(s): PROTIME in the last 72 hours.  Imaging: Ct Angio Chest Pe W/cm &/or Wo Cm  02/02/2015   CLINICAL DATA:  Chest tightness and discomfort. Dizziness and fatigue. Two syncopal episodes.  EXAM: CT ANGIOGRAPHY CHEST WITH CONTRAST  TECHNIQUE: Multidetector CT imaging of the chest was performed using the standard protocol during bolus administration of intravenous contrast. Multiplanar CT image reconstructions and MIPs were obtained to evaluate the vascular anatomy.   CONTRAST:  156mL OMNIPAQUE IOHEXOL 350 MG/ML SOLN  COMPARISON:  Chest radiograph 02/01/2015.  FINDINGS: Mediastinum: Massive BILATERAL lobar pulmonary emboli. Saddle embolus straddles the origins of the RIGHT and LEFT pulmonary arteries across the midline as seen on image 43 series 4. Heart size is increased. Significant RIGHT heart strain with RV/LV ratio of 2.0 (51/25.5 mm ratio). No pericardial fluid, thickening or calcification. No acute abnormality of the thoracic aorta or other great vessels of the mediastinum. No pathologically enlarged mediastinal or hilar lymph nodes. The esophagus is normal in appearance.  Lungs/Pleura: No consolidative airspace disease. No pleural effusions. No suspicious appearing pulmonary nodules or masses.  Upper Abdomen: Visualized portions of the upper abdomen are unremarkable.  Musculoskeletal: No aggressive appearing lytic or blastic lesions are noted in the visualized portions of the skeleton.  Review of the MIP images confirms the above findings.  IMPRESSION: Positive for acute PE with CT evidence of right heart strain (RV/LV Ratio = 2.0) consistent with at least submassive (intermediate risk) PE. The presence of right heart strain has been associated with an increased risk of morbidity and mortality. Please activate Code PE by paging [phone]. Findings discussed with ordering provider.   Electronically Signed   By: Rolla Flatten M.D.   On: 02/02/2015 08:27   Dg Chest Port 1 View  02/01/2015   CLINICAL DATA:  Shortness of breath and lightheadedness  EXAM: PORTABLE CHEST - 1 VIEW  COMPARISON:  None.  FINDINGS: Normal heart size and mediastinal contours. No acute infiltrate or edema. No effusion or pneumothorax. No acute osseous findings.  IMPRESSION: Negative portable chest.   Electronically Signed   By: Monte Fantasia M.D.   On: 02/01/2015 22:00   Dg Chest Portable 1 View  02/01/2015   CLINICAL DATA:  Chest pain and short of breath  EXAM: PORTABLE CHEST - 1 VIEW   COMPARISON:  None.  FINDINGS: The heart size and mediastinal contours are within normal limits. Both lungs are clear. The visualized skeletal structures are unremarkable.  IMPRESSION: No active disease.   Electronically Signed   By: Franchot Gallo M.D.   On: 02/01/2015 14:30   Personally viewed.   Telemetry: Sinus tach 100's Personally viewed.   EKG:  Sinus tach 100's NSTWchanges  Cardiac Studies:  ECHO P  Assessment/Plan:   59 year old with no prior DVT or clotting history with new saddle pulmonary embolism, right heart strain, elevated Troponin 0.25, 0.4, hypotension.  1) Acute large PE  - Heparin gtt since about 3 pm yesterday  - Code PE called ([phone])   - Discussed with Dr. Teryl Lucy of radiology   - Dr. Belva Crome with CCM evaluating, discussed.   - Potential candidate for TPA catheter directed.   - Discussed with him risk of PE, Troponin elevation (RV strain not ACS) 2:1 RV ratio.  Will be transferred to MICU Appreciate coordination of care. He and daughter aware.    SKAINS, [place] 02/02/2015, 8:51 AM

## 2015-02-02 NOTE — Progress Notes (Addendum)
Bilateral lower extremity venous duplex completed.  Right:  DVT noted in the peroneal vein.  No evidence of superficial thrombosis.  No Baker's cyst.  Left: DVT noted in the popliteal, gastrocnemius, and peroneal veins.  Superficial thrombosis noted in the left greater saphenous vein.  No Baker's cyst.

## 2015-02-02 NOTE — Significant Event (Signed)
Rapid Response Event Note  Overview: Time Called: 2135 Arrival Time: 2135 Event Type: Hypotension, Respiratory  Initial Focused Assessment:  While making rounds on [place] pt's daughter ran to nurses station requesting help because her father had "passed out in the bathroom" .  Upon arrival to room pt was being assisted to floor. Pt is awake, oriented times three, cooperative.  states he got lightheaded and dizzy in BR and slumped to the floor but did not  have LOC.  Pt cool, clammy, ashen with BP 89/69. ST on tele 120, RR 22 with RA sats 88%.  C/o SOB "like I just  ran a mile".  Also c/o midsternal Chest Pressure , nonradiating, rated 3/10 .  Pt admitted as NSTEMI, scheduled for cath lab in AM.   Interventions: NSS 500 cc bolus x 1 , EKG with no changes from prior. Stat PCXR , O2 at 4l  , attempted orthostatic BP but pressure dropped into 70's when pt sat up and was symptomatic with c/o lightheadednes and "going gray".  Dr Claiborne Billings at bedside at 2200hrs ordered second 40cc NSS IV, Stat ABG:   7.45 pCO2 25  PO2 72 .  Pt was assisted to bed at 2255  with BP 96/68.  Repeat EKG obtained with no changes from priors.  Daughter informed of pt status and plan of care by Dr Claiborne Billings.  Hand off report given to [facility], Therapist, sports.  Will follow hemodynamics,  resp status and labs. D-dimer and troponin are pending.     Event Summary: Name of Physician Notified: Dr Jules Husbands at 2150    at    Outcome: Transferred (Comment) (Upgraded to SDU status.  Stayed in 3W20)     Konya Fauble, Gust Brooms

## 2015-02-02 NOTE — Consult Note (Addendum)
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 He woke up on the morning of 02/01/2015 with chest tightness and associated orthostatic dizziness. Symptoms persisted and therefore reported to the emergency department. Initially admitted by cardiology due to elevation in troponin. EKG showed some right axis deviation. He was being ruled out for MI with troponins have been slightly high but flat. Overnight he has had several episodes of hypotension particularly orthostatic. He's been on IV heparin treatment for presumed non-STEMI since admission 02/01/2015 at 3 PM. He's been on nasal cannula oxygen although does unclear if this was a requirement on a subjective application. This morning he only feels marginally better and still with chest heaviness although off oxygen he did not desaturate and supine to sitting there were no orthostatic vital signs although he did feel dizzy. He had a CT angiogram done because of a high d-dimer this morning and confirm submassive pulmonary embolism with extreme RV strain.  Pulmonary critical care now consulted  He denies any trauma, recent surgeries, bleeding episodes, bleeding diathesis, head injury. He has been tolerating the IV heparin well. 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 Prior to Admission medications   Medication Sig Start Date End Date Taking? Authorizing Provider  buPROPion (WELLBUTRIN) 100 MG tablet Take 100 mg by mouth 3 (three) times daily.  01/24/15  Yes Historical Provider, MD  ibuprofen (ADVIL,MOTRIN) 200 MG tablet Take 600 mg by mouth every 6 (six) hours as needed (for pain).   Yes Historical Provider, MD  lisinopril (PRINIVIL,ZESTRIL) 10 MG tablet Take 10 mg by mouth at bedtime.  01/24/15  Yes Historical Provider, MD   No Known Allergies  FAMILY HISTORY:  has no family status information on file.  SOCIAL HISTORY:  reports that he has never smoked. He has never used smokeless tobacco. He reports that he does not drink alcohol or use illicit drugs.  REVIEW OF SYSTEMS:  11 point ROS +ve     VITAL SIGNS: Temp:  [97.5 F (36.4 C)-99.5 F (37.5 C)] 98.3 F (36.8 C) (04/21 0818) Pulse Rate:  [95-110] 102 (04/21 0818) Resp:  [13-27] 26 (04/21 0818) BP: (77-131)/(40-87) 96/79 mmHg (04/21 0818) SpO2:  [88 %-97 %] 97 % (04/21 0818) Weight:  [97.523 kg (215 lb)-99.791 kg (220 lb)] 99.791 kg (220 lb) (04/21 0400) HEMODYNAMICS:   VENTILATOR SETTINGS:   INTAKE / OUTPUT:  Intake/Output Summary (Last 24 hours) at 02/02/15 0942 Last data filed at 02/02/15 0500  Gross per 24 hour  Intake   1618 ml  Output    275 ml  Net   1343 ml    PHYSICAL EXAMINATION: General:  Well built male. Looks ok Neuro:  AXOX3. Speech normal. Moves all 4s HEENT:  NEck soft. Nasal cannula on Cardiovascular:  Normal heart sounds Lungs:  CTA bilaterally. No distress Abdomen:  Soft, non tender Musculoskeletal:  No cyanosis. No clubbing. NO edmea Skin:  Intact  LABS: PULMONARY  Recent Labs Lab 02/01/15 2219  PHART 7.453*  PCO2ART 24.7*  PO2ART 71.6*  HCO3 17.0*   TCO2 17.8  O2SAT 95.5    CBC  Recent Labs Lab 02/01/15 1201 02/02/15 0015  HGB 17.1* 15.0  HCT 50.6 45.1  WBC 17.3* 25.5*  PLT 224 215    COAGULATION  Recent Labs Lab 02/01/15 1532  INR 1.17    CARDIAC   Recent Labs Lab 02/01/15 1346 02/02/15 0015  TROPONINI 0.25* 0.42*   No results for input(s): PROBNP in the last 168 hours.   CHEMISTRY  Recent Labs Lab 02/01/15 1201 02/02/15 0015  NA 137 138  K 4.8 5.0  CL 106 104  CO2 21 23  GLUCOSE 167* 191*  BUN 19 18  CREATININE 1.21 1.40*  CALCIUM 9.1 8.5   Estimated Creatinine Clearance: 72.6 mL/min (by C-G formula based on Cr of 1.4).   LIVER  Recent Labs Lab 02/01/15 1532 02/02/15 0015  AST  --  27  ALT  --  22  ALKPHOS  --  73  BILITOT  --  1.7*  PROT  --  6.4  ALBUMIN  --  3.3*  INR 1.17  --      INFECTIOUS No results for input(s): LATICACIDVEN, PROCALCITON in the last 168 hours.   ENDOCRINE CBG (last 3)  No results for input(s): GLUCAP in the last 72 hours.       IMAGING x48h Ct Angio Chest Pe W/cm &/or Wo Cm  02/02/2015   CLINICAL DATA:  Chest tightness and discomfort. Dizziness and fatigue. Two syncopal episodes.  EXAM: CT ANGIOGRAPHY CHEST WITH CONTRAST  TECHNIQUE: Multidetector CT imaging of the chest was performed using the standard protocol during bolus administration of intravenous contrast. Multiplanar CT image reconstructions and MIPs were obtained to evaluate the vascular anatomy.  CONTRAST:  172mL OMNIPAQUE IOHEXOL 350 MG/ML SOLN  COMPARISON:  Chest radiograph 02/01/2015.  FINDINGS: Mediastinum: Massive BILATERAL lobar pulmonary emboli. Saddle embolus straddles the origins of the RIGHT and LEFT pulmonary arteries across the midline as seen on image 43 series 4. Heart size is increased. Significant RIGHT heart strain with RV/LV ratio of 2.0 (51/25.5 mm ratio). No pericardial fluid, thickening or calcification. No acute abnormality of the thoracic aorta or other great vessels  of the mediastinum. No pathologically enlarged mediastinal or hilar lymph nodes. The esophagus is normal in appearance.  Lungs/Pleura: No consolidative airspace disease. No pleural effusions. No suspicious appearing pulmonary nodules or masses.  Upper Abdomen: Visualized portions of the upper abdomen are unremarkable.  Musculoskeletal: No aggressive appearing lytic or blastic lesions are noted in the visualized portions of the skeleton.  Review of the MIP images confirms the above findings.  IMPRESSION: Positive for acute PE with CT evidence of right heart strain (RV/LV Ratio = 2.0) consistent with at least submassive (intermediate risk) PE. The presence of right heart strain has been associated with an increased risk of morbidity and mortality. Please activate Code PE by paging [phone]. Findings discussed with ordering provider.   Electronically Signed   By: Rolla Flatten M.D.   On: 02/02/2015 08:27   Dg Chest Port 1 View  02/01/2015   CLINICAL DATA:  Shortness of breath and lightheadedness  EXAM: PORTABLE CHEST -  1 VIEW  COMPARISON:  None.  FINDINGS: Normal heart size and mediastinal contours. No acute infiltrate or edema. No effusion or pneumothorax. No acute osseous findings.  IMPRESSION: Negative portable chest.   Electronically Signed   By: Monte Fantasia M.D.   On: 02/01/2015 22:00   Dg Chest Portable 1 View  02/01/2015   CLINICAL DATA:  Chest pain and short of breath  EXAM: PORTABLE CHEST - 1 VIEW  COMPARISON:  None.  FINDINGS: The heart size and mediastinal contours are within normal limits. Both lungs are clear. The visualized skeletal structures are unremarkable.  IMPRESSION: No active disease.   Electronically Signed   By: Franchot Gallo M.D.   On: 02/01/2015 14:30        ASSESSMENT / PLAN:  PULMONARY  A:  Acute Massive/Submassive PE  - overnight PESI score CLass 5 - highest risk score with hypotension, tachycardia and mild hypoxemia - this AM with > 12h of IV heparin - improved  to class 3 with resolution of hypotension  and hypoxemia but subjectively reporting only minimal imprvement and desire for more aggressive Rx  P:   -DUplex LE stat -IV heparin gtt  -No definite indication for systemic lysis due to current normal BP and resolution of hypoxemia - can be used for rescue Rx if he fails  - Discussed US guided local tPA - (EKOS) procedure - explained that evidence is on pilot trials and is theoretical intermediate risk for bleeding between heparin and systmic lysis but with potential for equal effiacy of early symptom resolution and RV strain resolution. Explained final data on natl trials stil pending but Rx is offered and good local experience at [facility]  - will likely need life long anticogaution or for several years - needs discussion at later point  Based on all of above and his personal desire esp because of presentation hypotension - he wans to undergo EKOS procedure   CARDIOVASCULAR CVL A: Trop rise due to PE P:  Track  RENAL  Acute Kidney Injury (any one) Increase in SCr by > 0.3 within 48 hours Increase SCr to > 1.5 times baseline Urine volume < 0.5 ml/kg/h for 6 hrs  Stage: Risk: 1.5x increase in creatinine or GFR decrease by 25% or UOP <0.72ml/kgperhr for 6 hrs Injury: 2x increase in creatinine or GFR decrease by 50% or UOP < 0.70ml/kgperhr for 12 hr Failure:3X increase in creatinine or GFR decrease by 75% or UOP < 0.74ml/kgperhr for 12 hr or  anuria 12 hrs Loss: complete loss of kidney function for more then 4 weeks End-stage renal disease:Complete loss of kidney function for more then 3 months    A:  AKI  - new 4./20/16 P:  Hydrate   GASTROINTESTINAL A:  NPO  P:   PPI  HEMATOLOGIC A:  At risk for bleeding post lysis P:  monitor  INFECTIOUS A:  Nil acute P:   monitor  ENDOCRINE A:  Nil acute   P:    monitor  NEUROLOGIC A:  intact P:   monitor  FAMILY  - Updates: updated him and duaghter in detail 02/01/15    TODAY'S SUMMARY: Move to ICU. Do EKOS procedure. Dr Reesa Chew of IR called    The patient is critically ill with multiple organ systems failure and requires high complexity decision making for assessment and support, frequent evaluation and titration of therapies, application of advanced monitoring technologies and extensive interpretation of multiple databases.   Critical Care Time devoted to patient care  services described in this note is  60  Minutes. This time reflects time of care of this signee Dr Brand Males. This critical care time does not reflect procedure time, or teaching time or supervisory time of PA/NP/Med student/Med Resident etc but could involve care discussion time    Dr. Brand Males, M.D., [facility].C.P Pulmonary and Critical Care Medicine Staff Physician [place] [facility] Pager: [phone], If no answer or between  15:00h - 7:00h: call [phone]  02/02/2015 10:01 AM           Dr. Brand Males, M.D., [facility].C.P Pulmonary and Critical Care Medicine Staff Physician [place] [facility] Pager: [phone], If no answer or between  15:00h - 7:00h: call [phone]  02/02/2015 9:42 AM

## 2015-02-02 NOTE — Progress Notes (Signed)
Attempted to give report to nurse receiving pt in [place] (ICU) , nurse not ready to take report at this time. Will call again.  Ferdinand Lango, RN

## 2015-02-02 NOTE — Progress Notes (Signed)
ANTICOAGULATION CONSULT NOTE - Follow up Pharmacy Consult for Heparin Indication: pulmonary embolus  No Known Allergies  Patient Measurements: Height: 6\' 2"  (188 cm) Weight: 221 lb 5.5 oz (100.4 kg) IBW/kg (Calculated) : 82.2 Heparin Dosing Weight: actual weight 97.5 kg  Vital Signs: Temp: 98.6 F (37 C) (04/21 1030) Temp Source: Oral (04/21 1030) BP: 132/94 mmHg (04/21 1030) Pulse Rate: 106 (04/21 1030)  Labs:  Recent Labs  02/01/15 1201 02/01/15 1346 02/01/15 1532 02/01/15 1548 02/01/15 2259 02/02/15 0015 02/02/15 0936  HGB 17.1*  --   --   --   --  15.0  --   HCT 50.6  --   --   --   --  45.1  --   PLT 224  --   --   --   --  215  --   APTT  --   --  28  --   --   --   --   LABPROT  --   --  15.1  --   --   --   --   INR  --   --  1.17  --   --   --   --   HEPARINUNFRC  --   --   --  <0.10* 0.30  --  0.25*  CREATININE 1.21  --   --   --   --  1.40*  --   TROPONINI  --  0.25*  --   --   --  0.42*  --     Estimated Creatinine Clearance: 72.8 mL/min (by C-G formula based on Cr of 1.4).   Assessment: 59 year old male with large saddle PE with plans for PE lysis procedure today First heparin level therapeutic at 0.30, second level slightly low at 0.25 No bleeding noted  Goal of Therapy:  Heparin level 0.3-0.7 units/ml Monitor platelets by anticoagulation protocol: Yes   Plan:  Increase heparin to 1700 units / hr Follow up heparin infusion after Ekos procedure  Thank you. Anette Guarneri, PharmD [phone] 02/02/2015 11:11 AM

## 2015-02-02 NOTE — Progress Notes (Addendum)
MD Claiborne Billings notified of troponin increase from 0.25 to 0.42. Patient resting comfortably. VSS on 4L O2 via humidified nasal cannula. No new orders received, will continue to monitor closely.

## 2015-02-03 ENCOUNTER — Encounter (HOSPITAL_COMMUNITY)
Admission: EM | Disposition: A | Discharge status: Home / Self Care | Payer: Self-pay | Source: Home / Self Care | Attending: Internal Medicine

## 2015-02-03 ENCOUNTER — Inpatient Hospital Stay (HOSPITAL_COMMUNITY): Payer: Managed Care, Other (non HMO)

## 2015-02-03 DIAGNOSIS — I2699 Other pulmonary embolism without acute cor pulmonale: Secondary | ICD-10-CM

## 2015-02-03 LAB — BASIC METABOLIC PANEL
Anion gap: 8 (ref 5–15)
BUN: 15 mg/dL (ref 6–23)
CO2: 20 mmol/L (ref 19–32)
CREATININE: 0.98 mg/dL (ref 0.50–1.35)
Calcium: 8.1 mg/dL — ABNORMAL LOW (ref 8.4–10.5)
Chloride: 109 mmol/L (ref 96–112)
GFR calc non Af Amer: 89 mL/min — ABNORMAL LOW (ref >90)
Glucose, Bld: 110 mg/dL — ABNORMAL HIGH (ref 70–99)
POTASSIUM: 4 mmol/L (ref 3.5–5.1)
Sodium: 137 mmol/L (ref 135–145)

## 2015-02-03 LAB — CBC WITH DIFFERENTIAL/PLATELET
BASOS ABS: 0.1 10*3/uL (ref 0.0–0.1)
Basophils Relative: 1 % (ref 0–1)
EOS PCT: 1 % (ref 0–5)
Eosinophils Absolute: 0.1 10*3/uL (ref 0.0–0.7)
HCT: 39.1 % (ref 39.0–52.0)
Hemoglobin: 13.1 g/dL (ref 13.0–17.0)
Lymphocytes Relative: 17 % (ref 12–46)
Lymphs Abs: 2 10*3/uL (ref 0.7–4.0)
MCH: 29.8 pg (ref 26.0–34.0)
MCHC: 33.5 g/dL (ref 30.0–36.0)
MCV: 88.9 fL (ref 78.0–100.0)
MONO ABS: 1 10*3/uL (ref 0.1–1.0)
Monocytes Relative: 9 % (ref 3–12)
Neutro Abs: 8.3 10*3/uL — ABNORMAL HIGH (ref 1.7–7.7)
Neutrophils Relative %: 72 % (ref 43–77)
PLATELETS: 158 10*3/uL (ref 150–400)
RBC: 4.4 MIL/uL (ref 4.22–5.81)
RDW: 13.8 % (ref 11.5–15.5)
WBC: 11.5 10*3/uL — ABNORMAL HIGH (ref 4.0–10.5)

## 2015-02-03 LAB — CBC
HEMATOCRIT: 39.9 % (ref 39.0–52.0)
Hemoglobin: 13.2 g/dL (ref 13.0–17.0)
MCH: 29.4 pg (ref 26.0–34.0)
MCHC: 33.1 g/dL (ref 30.0–36.0)
MCV: 88.9 fL (ref 78.0–100.0)
PLATELETS: 188 10*3/uL (ref 150–400)
RBC: 4.49 MIL/uL (ref 4.22–5.81)
RDW: 13.9 % (ref 11.5–15.5)
WBC: 11.8 10*3/uL — ABNORMAL HIGH (ref 4.0–10.5)

## 2015-02-03 LAB — TROPONIN I: Troponin I: 0.68 ng/mL (ref <0.031)

## 2015-02-03 LAB — FIBRINOGEN
Fibrinogen: 349 mg/dL (ref 204–475)
Fibrinogen: 395 mg/dL (ref 204–475)

## 2015-02-03 LAB — MAGNESIUM: Magnesium: 2 mg/dL (ref 1.5–2.5)

## 2015-02-03 LAB — HEPARIN LEVEL (UNFRACTIONATED)
Heparin Unfractionated: <0.1 IU/mL — ABNORMAL LOW (ref 0.30–0.70)
Heparin Unfractionated: 0.35 IU/mL (ref 0.30–0.70)

## 2015-02-03 LAB — PHOSPHORUS: Phosphorus: 2.3 mg/dL (ref 2.3–4.6)

## 2015-02-03 LAB — LACTIC ACID, PLASMA: LACTIC ACID, VENOUS: 1.3 mmol/L (ref 0.5–2.0)

## 2015-02-03 SURGERY — LEFT HEART CATHETERIZATION WITH CORONARY ANGIOGRAM
Anesthesia: LOCAL

## 2015-02-03 MED ORDER — RIVAROXABAN 15 MG PO TABS
15.0000 mg | ORAL_TABLET | Freq: Two times a day (BID) | ORAL | Status: DC
  Administered 2015-02-03 – 2015-02-06 (×6): 15 mg via ORAL
  Filled 2015-02-03 (×9): qty 1

## 2015-02-03 MED ORDER — RIVAROXABAN (XARELTO) EDUCATION KIT FOR DVT/PE PATIENTS
PACK | Freq: Once | Status: AC
  Administered 2015-02-03: 22:00:00
  Filled 2015-02-03: qty 1

## 2015-02-03 MED ORDER — RIVAROXABAN 15 MG PO TABS
15.0000 mg | ORAL_TABLET | Freq: Two times a day (BID) | ORAL | Status: DC
  Filled 2015-02-03 (×2): qty 1

## 2015-02-03 MED ORDER — RIVAROXABAN 20 MG PO TABS: 20.0000 mg | ORAL_TABLET | Freq: Every day | ORAL | Status: DC

## 2015-02-03 NOTE — Sedation Documentation (Signed)
Left PA pressure 51/22 Mean 34

## 2015-02-03 NOTE — Sedation Documentation (Signed)
Main right pulmonary artery pressure 47/33 Mean 38

## 2015-02-03 NOTE — Procedures (Signed)
Patient completed 12 hour catheter-directed thrombolytic therapy.  Pressures obtained from bilateral pulmonary artery catheters.   Left PA = 51/22, mean 34 Right PA = 52/27, mean 38 Main PA = 47/33, mean 38  Despite elevated pressures, patient appears to be doing well clinically.  Discussed with Dr. Elsworth Soho and will stop TPA infusion and remove catheters.

## 2015-02-03 NOTE — Discharge Instructions (Signed)
Information on my medicine - XARELTO (rivaroxaban)  This medication education was reviewed with me or my healthcare representative as part of my discharge preparation.  The pharmacist that spoke with me during my hospital stay was:  Blossom Hoops, [place]? Xarelto was prescribed to treat blood clots that may have been found in the veins of your legs (deep vein thrombosis) or in your lungs (pulmonary embolism) and to reduce the risk of them occurring again.  What do you need to know about Xarelto? The starting dose is one 15 mg tablet taken TWICE daily with food for the FIRST 21 DAYS then on (enter date)  02/24/2015  the dose is changed to one 20 mg tablet taken ONCE A DAY with your evening meal.  DO NOT stop taking Xarelto without talking to the health care provider who prescribed the medication.  Refill your prescription for 20 mg tablets before you run out.  After discharge, you should have regular check-up appointments with your healthcare provider that is prescribing your Xarelto.  In the future your dose may need to be changed if your kidney function changes by a significant amount.  What do you do if you miss a dose? If you are taking Xarelto TWICE DAILY and you miss a dose, take it as soon as you remember. You may take two 15 mg tablets (total 30 mg) at the same time then resume your regularly scheduled 15 mg twice daily the next day.  If you are taking Xarelto ONCE DAILY and you miss a dose, take it as soon as you remember on the same day then continue your regularly scheduled once daily regimen the next day. Do not take two doses of Xarelto at the same time.   Important Safety Information Xarelto is a blood thinner medicine that can cause bleeding. You should call your healthcare provider right away if you experience any of the following: ? Bleeding from an injury or your nose that does not stop. ? Unusual colored urine (red or dark brown) or  unusual colored stools (red or black). ? Unusual bruising for unknown reasons. ? A serious fall or if you hit your head (even if there is no bleeding).  Some medicines may interact with Xarelto and might increase your risk of bleeding while on Xarelto. To help avoid this, consult your healthcare provider or pharmacist prior to using any new prescription or non-prescription medications, including herbals, vitamins, non-steroidal anti-inflammatory drugs (NSAIDs) and supplements.  This website has more information on Xarelto: [email or web address]-benson.[email or web address]

## 2015-02-03 NOTE — Sedation Documentation (Signed)
Sheath pulled by [employer] tech and pressure being held.

## 2015-02-03 NOTE — Progress Notes (Signed)
Unfractionated Heparin level was abnormally inaccurate around 0359. Alcide Goodness RPH ordered to redraw in regards to this inaccuracy. After redrawn, level is in range with previous levels at 0.35 now. Will continue to monitor and assess.

## 2015-02-03 NOTE — Sedation Documentation (Signed)
Transferred back to 56M and report given to his nurse. Dressing checked with his 56M nurse and clean dry intact at level 0.  No sedation given for this procedure.

## 2015-02-03 NOTE — Progress Notes (Addendum)
ANTICOAGULATION CONSULT NOTE - Initial Consult  Pharmacy Consult for Xarelto Indication: atrial fibrillation  No Known Allergies  Patient Measurements: Height: 6\' 2"  (188 cm) Weight: 221 lb 5.5 oz (100.4 kg) IBW/kg (Calculated) : 82.2  Vital Signs: Temp: 97.4 F (36.3 C) (04/22 0807) Temp Source: Oral (04/22 0807) BP: 149/85 mmHg (04/22 1010) Pulse Rate: 91 (04/22 1010)  Labs:  Recent Labs  02/01/15 1201 02/01/15 1346 02/01/15 1532  02/02/15 0015  02/02/15 1854 02/02/15 2300 02/03/15 0115 02/03/15 0359 02/03/15 0520 02/03/15 0654  HGB 17.1*  --   --   --  15.0  < > 13.9  --  13.2  --   --  13.1  HCT 50.6  --   --   --  45.1  < > 41.2  --  39.9  --   --  39.1  PLT 224  --   --   --  215  < > 180  --  188  --   --  158  APTT  --   --  28  --   --   --   --   --   --   --   --   --   LABPROT  --   --  15.1  --   --   --   --   --   --   --   --   --   INR  --   --  1.17  --   --   --   --   --   --   --   --   --   HEPARINUNFRC  --   --   --   < >  --   < >  --  0.41  --  <0.10* 0.35  --   CREATININE 1.21  --   --   --  1.40*  --   --   --   --   --   --  0.98  TROPONINI  --  0.25*  --   --  0.42*  --   --   --   --   --   --  0.68*  < > = values in this interval not displayed.  Estimated Creatinine Clearance: 104 mL/min (by C-G formula based on Cr of 0.98).   Medical History: Past Medical History  Diagnosis Date  . Hypertension   . Depression   . Family history of adverse reaction to anesthesia     "Mom gets PONV; oldest daughter is not as sensitive to it as dosage would suggest;she  takes more than want they would expect"  . Non Q wave myocardial infarction 02/01/2015  . Sleep apnea     "not dx'd; family says I have it" (02/01/2015)  . GERD (gastroesophageal reflux disease)     hx  . Arthritis     "probably a little; finger joints" (02/01/2015)  . Dislocated cervical vertebra   . Tinnitus   . BPH (benign prostatic hypertrophy)     "treated for it and it got  somewhat better"    Medications:  Scheduled:  . buPROPion  100 mg Oral TID  . pantoprazole  40 mg Oral Q1200  . Rivaroxaban  15 mg Oral BID WC  . [START ON 02/25/2015] rivaroxaban  20 mg Oral Daily  . sodium chloride  3 mL Intravenous Q12H  . sodium chloride  3 mL Intravenous Q12H   Infusions:  . sodium chloride  100 mL/hr at 02/03/15 0900  . sodium chloride 35 mL/hr at 02/03/15 0900  . sodium chloride 35 mL/hr at 02/03/15 0900  . sodium chloride 15 mL/hr at 02/03/15 0900  . sodium chloride 15 mL/hr at 02/03/15 0900  . heparin 1,700 Units/hr (02/03/15 0900)    Assessment: 59 year old admitted 02/01/2015 with SOB, CP - found to have D-dimer > 20 resulting in submassive large PE, TPA lysis initiated via EKOS protocol.  Pharmacy has been consulted to dose Xarelto in the setting of PE treatment.    Patient has completed last heparin level check.  Last heparin level was therapeutic at 0.35 (lower end).  H/H stable, plt wnl.  Of note, troponin increased from 0.42 to 0.68 will continue to follow.  CrCL > 100, LFTs wnl.    Of note, patient had some slight oozing from the cath site provoked by patient.  However, this seems to have resolved.  No other reports of bleeding at this time.  Plan:  - Begin Xarelto 15 mg PO BID for 21 days - Transition to Xarelto 20 mg PO daily on 02/24/2015 - Monitor renal function - Monitor for signs and symptoms of bleeding - Patient education  Hassie Bruce, Pharm. D. Clinical Pharmacy Resident Pager: [phone] Ph: [phone] 02/03/2015 11:39 AM

## 2015-02-03 NOTE — Progress Notes (Signed)
PULMONARY / CRITICAL CARE MEDICINE   Name: Jonathan Rios MRN: [id number] DOB: 1956-05-16    ADMISSION DATE:  02/01/2015 CONSULTATION DATE:  02/01/15   REFERRING MD :  Dr Candee Furbish  CHIEF COMPLAINT:  Sub-Massive PE  BRIEF PATIENT DESCRIPTION:    59 year old male with PMHx of HTN admitted with presumed NSTEMI and later found to have submassive pulmonary embolism with extreme RV strain who went for EKOS yesterday.  SUBJECTIVE:  Patient is doing well this morning. No complaints. He denies any recent trauma, surgery, or history of cancer. Father had clots in his legs, but patient has not prior history of DVT or PE. Recent car ride to [place] for Easter and a very sedentary job.   Bleeding from right femoral sheath overnight which stopped with pressure.   VITAL SIGNS: Temp:  [97.4 F (36.3 C)-99.1 F (37.3 C)] 97.4 F (36.3 C) (04/22 0807) Pulse Rate:  [90-106] 91 (04/22 1010) Resp:  [12-30] 27 (04/22 1010) BP: (107-155)/(75-98) 149/85 mmHg (04/22 1010) SpO2:  [88 %-98 %] 93 % (04/22 1010)  VENTILATOR SETTINGS: NONE   INTAKE / OUTPUT:  Intake/Output Summary (Last 24 hours) at 02/03/15 1041 Last data filed at 02/03/15 0900  Gross per 24 hour  Intake 5079.68 ml  Output   1725 ml  Net 3354.68 ml   PHYSICAL EXAMINATION: General:  Well-developed, well-nourished Neuro:  AAOX3. Speech normal. Moves all 4 extremities Cardiovascular:  Tachycardic, regular rhythm, no MRG. Lungs:  CTA bilaterally, no WRR. Abdomen:  Soft, non tender, non-distending Extremities:  Trace LEE b/l, non-tender. Right femoral sheath without active bleeding. Skin:  Intact  LABS: PULMONARY  Recent Labs Lab 02/01/15 2219  PHART 7.453*  PCO2ART 24.7*  PO2ART 71.6*  HCO3 17.0*  TCO2 17.8  O2SAT 95.5    CBC  Recent Labs Lab 02/02/15 1854 02/03/15 0115 02/03/15 0654  HGB 13.9 13.2 13.1  HCT 41.2 39.9 39.1  WBC 12.7* 11.8* 11.5*  PLT 180 188 158   COAGULATION  Recent Labs Lab  02/01/15 1532  INR 1.17   CARDIAC    Recent Labs Lab 02/01/15 1346 02/02/15 0015 02/03/15 0654  TROPONINI 0.25* 0.42* 0.68*   CHEMISTRY  Recent Labs Lab 02/01/15 1201 02/02/15 0015 02/03/15 0654  NA 137 138 137  K 4.8 5.0 4.0  CL 106 104 109  CO2 21 23 20   GLUCOSE 167* 191* 110*  BUN 19 18 15   CREATININE 1.21 1.40* 0.98  CALCIUM 9.1 8.5 8.1*  MG  --   --  2.0  PHOS  --   --  2.3   Estimated Creatinine Clearance: 104 mL/min (by C-G formula based on Cr of 0.98).  LIVER  Recent Labs Lab 02/01/15 1532 02/02/15 0015  AST  --  27  ALT  --  22  ALKPHOS  --  73  BILITOT  --  1.7*  PROT  --  6.4  ALBUMIN  --  3.3*  INR 1.17  --    INFECTIOUS  Recent Labs Lab 02/02/15 1120 02/03/15 0115  LATICACIDVEN 2.2* 1.3   IMAGING x48h Ct Angio Chest Pe W/cm &/or Wo Cm  02/02/2015   CLINICAL DATA:  Chest tightness and discomfort. Dizziness and fatigue. Two syncopal episodes.  EXAM: CT ANGIOGRAPHY CHEST WITH CONTRAST  TECHNIQUE: Multidetector CT imaging of the chest was performed using the standard protocol during bolus administration of intravenous contrast. Multiplanar CT image reconstructions and MIPs were obtained to evaluate the vascular anatomy.  CONTRAST:  172mL OMNIPAQUE IOHEXOL  350 MG/ML SOLN  COMPARISON:  Chest radiograph 02/01/2015.  FINDINGS: Mediastinum: Massive BILATERAL lobar pulmonary emboli. Saddle embolus straddles the origins of the RIGHT and LEFT pulmonary arteries across the midline as seen on image 43 series 4. Heart size is increased. Significant RIGHT heart strain with RV/LV ratio of 2.0 (51/25.5 mm ratio). No pericardial fluid, thickening or calcification. No acute abnormality of the thoracic aorta or other great vessels of the mediastinum. No pathologically enlarged mediastinal or hilar lymph nodes. The esophagus is normal in appearance.  Lungs/Pleura: No consolidative airspace disease. No pleural effusions. No suspicious appearing pulmonary nodules or  masses.  Upper Abdomen: Visualized portions of the upper abdomen are unremarkable.  Musculoskeletal: No aggressive appearing lytic or blastic lesions are noted in the visualized portions of the skeleton.  Review of the MIP images confirms the above findings.  IMPRESSION: Positive for acute PE with CT evidence of right heart strain (RV/LV Ratio = 2.0) consistent with at least submassive (intermediate risk) PE. The presence of right heart strain has been associated with an increased risk of morbidity and mortality. Please activate Code PE by paging [phone]. Findings discussed with ordering provider.   Electronically Signed   By: Rolla Flatten M.D.   On: 02/02/2015 08:27   Ir Angiogram Pulmonary Bilateral Selective  02/02/2015   CLINICAL DATA:  Acute sub massive bilateral pulmonary emboli with evidence of right heart strain. Persistent labored breathing and shortness of breath.  EXAM: ULTRASOUND GUIDANCE FOR VASCULAR ACCESS  BILATERAL PULMONARY ARTERIAL CATHETERIZATIONS AND ANGIOGRAMS  INITIATION OF BILATERAL ULTRASOUND ASSISTED TRANSCATHETER PULMONARY EMBOLUS THROMBOLYSIS (EKOS)  Date:  4/21/20164/21/2016 1:05 pm  Radiologist:  M. Daryll Brod, MD  Guidance:  Ultrasound and fluoroscopic  FLUOROSCOPY TIME:  20 minutes 12 seconds, 552 mGy  MEDICATIONS AND MEDICAL HISTORY: 2 mg Versed, 75 mcg fentanyl  ANESTHESIA/SEDATION: 40 minutes  CONTRAST:  72mL OMNIPAQUE IOHEXOL 300 MG/ML  SOLN  COMPLICATIONS: None immediate  PROCEDURE: Informed consent was obtained from the patient following explanation of the procedure, risks, benefits and alternatives. The patient understands, agrees and consents for the procedure. All questions were addressed. A time out was performed.  Maximal barrier sterile technique utilized including caps, mask, sterile gowns, sterile gloves, large sterile drape, hand hygiene, and Betadine.  Under sterile conditions and local anesthesia, ultrasound micropuncture access performed of the right common  femoral vein. Images obtained for documentation. Two tandem punctures were performed of the right common femoral vein for insertion of 2 6 French sheaths over guidewires.  Catheter and guidewire access was advanced into the right atrium. The vert catheter and glide wire were manipulated into the left pulmonary artery initially.  Initial left pulmonary artery pressure obtained:  45/10 (24).  Guidewire and catheter access were advanced into a lower lobe branch of the left pulmonary vascularity. Selective left pulmonary angiogram confirms position of the access. Central filling defect present compatible with the known extensive bilateral pulmonary emboli.  Next, the second catheter and guidewire were manipulated in a similar fashion into the right lower lobe pulmonary artery. Right lower lobe pulmonary angiogram confirms position. Large filling defects present in the right lower lobe pulmonary artery compatible with known pulmonary emboli.  Over Rosen guidewires, EKOS ultrasound assisted infusion catheters were advanced into the pulmonary arteries bilaterally. Left infusion catheter has a 12 cm length and a right infusion catheter 18 cm length. Position confirmed with fluoroscopy. Images obtained for documentation. Access secured externally at the right groin site.  Transcatheter ultrasound assisted thrombolysis will be  initiated at 1 milligram/hour per catheter for 12 hours (total dose 24 mg). At that time, repeat pulmonary artery pressure will be obtained.  IMPRESSION: Bilateral pulmonary arterial angiograms positive for extensive bilateral pulmonary emboli as previously documented on the CTA.  Initial left pulmonary artery pressure 45/10 mm Hg  Successful insertion of bilateral EKOS ultrasound assisted infusion catheters to begin PE lysis.   Electronically Signed   By: Jerilynn Mages.  Shick M.D.   On: 02/02/2015 14:18   Ir US Guide Vasc Access Right  02/02/2015   CLINICAL DATA:  Acute sub massive bilateral pulmonary emboli  with evidence of right heart strain. Persistent labored breathing and shortness of breath.  EXAM: ULTRASOUND GUIDANCE FOR VASCULAR ACCESS  BILATERAL PULMONARY ARTERIAL CATHETERIZATIONS AND ANGIOGRAMS  INITIATION OF BILATERAL ULTRASOUND ASSISTED TRANSCATHETER PULMONARY EMBOLUS THROMBOLYSIS (EKOS)  Date:  4/21/20164/21/2016 1:05 pm  Radiologist:  M. Daryll Brod, MD  Guidance:  Ultrasound and fluoroscopic  FLUOROSCOPY TIME:  20 minutes 12 seconds, 552 mGy  MEDICATIONS AND MEDICAL HISTORY: 2 mg Versed, 75 mcg fentanyl  ANESTHESIA/SEDATION: 40 minutes  CONTRAST:  90mL OMNIPAQUE IOHEXOL 300 MG/ML  SOLN  COMPLICATIONS: None immediate  PROCEDURE: Informed consent was obtained from the patient following explanation of the procedure, risks, benefits and alternatives. The patient understands, agrees and consents for the procedure. All questions were addressed. A time out was performed.  Maximal barrier sterile technique utilized including caps, mask, sterile gowns, sterile gloves, large sterile drape, hand hygiene, and Betadine.  Under sterile conditions and local anesthesia, ultrasound micropuncture access performed of the right common femoral vein. Images obtained for documentation. Two tandem punctures were performed of the right common femoral vein for insertion of 2 6 French sheaths over guidewires.  Catheter and guidewire access was advanced into the right atrium. The vert catheter and glide wire were manipulated into the left pulmonary artery initially.  Initial left pulmonary artery pressure obtained:  45/10 (24).  Guidewire and catheter access were advanced into a lower lobe branch of the left pulmonary vascularity. Selective left pulmonary angiogram confirms position of the access. Central filling defect present compatible with the known extensive bilateral pulmonary emboli.  Next, the second catheter and guidewire were manipulated in a similar fashion into the right lower lobe pulmonary artery. Right lower lobe  pulmonary angiogram confirms position. Large filling defects present in the right lower lobe pulmonary artery compatible with known pulmonary emboli.  Over Rosen guidewires, EKOS ultrasound assisted infusion catheters were advanced into the pulmonary arteries bilaterally. Left infusion catheter has a 12 cm length and a right infusion catheter 18 cm length. Position confirmed with fluoroscopy. Images obtained for documentation. Access secured externally at the right groin site.  Transcatheter ultrasound assisted thrombolysis will be initiated at 1 milligram/hour per catheter for 12 hours (total dose 24 mg). At that time, repeat pulmonary artery pressure will be obtained.  IMPRESSION: Bilateral pulmonary arterial angiograms positive for extensive bilateral pulmonary emboli as previously documented on the CTA.  Initial left pulmonary artery pressure 45/10 mm Hg  Successful insertion of bilateral EKOS ultrasound assisted infusion catheters to begin PE lysis.   Electronically Signed   By: Jerilynn Mages.  Shick M.D.   On: 02/02/2015 14:18   Dg Chest Port 1 View  02/01/2015   CLINICAL DATA:  Shortness of breath and lightheadedness  EXAM: PORTABLE CHEST - 1 VIEW  COMPARISON:  None.  FINDINGS: Normal heart size and mediastinal contours. No acute infiltrate or edema. No effusion or pneumothorax. No acute osseous findings.  IMPRESSION:  Negative portable chest.   Electronically Signed   By: Monte Fantasia M.D.   On: 02/01/2015 22:00   Dg Chest Portable 1 View  02/01/2015   CLINICAL DATA:  Chest pain and short of breath  EXAM: PORTABLE CHEST - 1 VIEW  COMPARISON:  None.  FINDINGS: The heart size and mediastinal contours are within normal limits. Both lungs are clear. The visualized skeletal structures are unremarkable.  IMPRESSION: No active disease.   Electronically Signed   By: Franchot Gallo M.D.   On: 02/01/2015 14:30   Ir Infusion Thrombol Arterial Initial (ms)  02/02/2015   CLINICAL DATA:  Acute sub massive bilateral  pulmonary emboli with evidence of right heart strain. Persistent labored breathing and shortness of breath.  EXAM: ULTRASOUND GUIDANCE FOR VASCULAR ACCESS  BILATERAL PULMONARY ARTERIAL CATHETERIZATIONS AND ANGIOGRAMS  INITIATION OF BILATERAL ULTRASOUND ASSISTED TRANSCATHETER PULMONARY EMBOLUS THROMBOLYSIS (EKOS)  Date:  4/21/20164/21/2016 1:05 pm  Radiologist:  M. Daryll Brod, MD  Guidance:  Ultrasound and fluoroscopic  FLUOROSCOPY TIME:  20 minutes 12 seconds, 552 mGy  MEDICATIONS AND MEDICAL HISTORY: 2 mg Versed, 75 mcg fentanyl  ANESTHESIA/SEDATION: 40 minutes  CONTRAST:  26mL OMNIPAQUE IOHEXOL 300 MG/ML  SOLN  COMPLICATIONS: None immediate  PROCEDURE: Informed consent was obtained from the patient following explanation of the procedure, risks, benefits and alternatives. The patient understands, agrees and consents for the procedure. All questions were addressed. A time out was performed.  Maximal barrier sterile technique utilized including caps, mask, sterile gowns, sterile gloves, large sterile drape, hand hygiene, and Betadine.  Under sterile conditions and local anesthesia, ultrasound micropuncture access performed of the right common femoral vein. Images obtained for documentation. Two tandem punctures were performed of the right common femoral vein for insertion of 2 6 French sheaths over guidewires.  Catheter and guidewire access was advanced into the right atrium. The vert catheter and glide wire were manipulated into the left pulmonary artery initially.  Initial left pulmonary artery pressure obtained:  45/10 (24).  Guidewire and catheter access were advanced into a lower lobe branch of the left pulmonary vascularity. Selective left pulmonary angiogram confirms position of the access. Central filling defect present compatible with the known extensive bilateral pulmonary emboli.  Next, the second catheter and guidewire were manipulated in a similar fashion into the right lower lobe pulmonary artery.  Right lower lobe pulmonary angiogram confirms position. Large filling defects present in the right lower lobe pulmonary artery compatible with known pulmonary emboli.  Over Rosen guidewires, EKOS ultrasound assisted infusion catheters were advanced into the pulmonary arteries bilaterally. Left infusion catheter has a 12 cm length and a right infusion catheter 18 cm length. Position confirmed with fluoroscopy. Images obtained for documentation. Access secured externally at the right groin site.  Transcatheter ultrasound assisted thrombolysis will be initiated at 1 milligram/hour per catheter for 12 hours (total dose 24 mg). At that time, repeat pulmonary artery pressure will be obtained.  IMPRESSION: Bilateral pulmonary arterial angiograms positive for extensive bilateral pulmonary emboli as previously documented on the CTA.  Initial left pulmonary artery pressure 45/10 mm Hg  Successful insertion of bilateral EKOS ultrasound assisted infusion catheters to begin PE lysis.   Electronically Signed   By: Jerilynn Mages.  Shick M.D.   On: 02/02/2015 14:18    ASSESSMENT / PLAN:  PULMONARY A:  Acute Submassive PE s/p EKOS P:   Continue IV heparin gtt--> transition to Xarelto per pharmacy now that sheath removed S/p EKOS-tolerated well, tPA stopped around 0200 Indefinite anticoagulation  given unprovoked PE Consider coagulopathy work up Transfer to [place] A:  Acute PE as above Bilateral LE DVTs NSTEMI 2/2 PE HTN P:  Continue IV heparin gtt with transition to Xarelto per pharmacy Currently normotensive, consider restarting home lisinopril if hypertensive  RENAL A:   AKI, resolved: Cr 1/21>1.40>0.98 P:  Repeat BMET NS 100 cc/hr  GASTROINTESTINAL A:   No active issues P:   Protonix 40 mg po QD Clear liquid diet ADAT  HEMATOLOGIC A:   Hgb stable at 13.2 Bleeding from femoral sheath site-resolved P:  Repeat CBC tomorrow am  INFECTIOUS A:   No active issues P:    Monitor  ENDOCRINE A:   No active issues P:   Monitor  NEUROLOGIC A:   History of Depression and Axienty P:   Xanax 0.25 mg BID prn Wellbutrin 100 mg TID Ambien 5 mg QHS prn  FAMILY  - Updated family 02/01/15  TODAY'S SUMMARY: 59 year old male with PMHx of HTN admitted with presumed NSTEMI and later found to have submassive pulmonary embolism with extreme RV strain who went for EKOS yesterday. Patient is doing very well s/p procedure.  Osa Craver, DO PGY-1 Internal Medicine Resident Pager # [phone] 02/03/2015 10:41 AM

## 2015-02-03 NOTE — Progress Notes (Signed)
  Echocardiogram 2D Echocardiogram has been performed.  Donata Clay 02/03/2015, 4:19 PM

## 2015-02-03 NOTE — Progress Notes (Addendum)
ANTICOAGULATION CONSULT NOTE - Follow Up Consult  Pharmacy Consult for heparin Indication: pulmonary embolus  Labs:  Recent Labs  02/01/15 1201 02/01/15 1346 02/01/15 1532  02/02/15 0015  02/02/15 1254  02/02/15 1854 02/02/15 2300 02/03/15 0115 02/03/15 0359 02/03/15 0520  HGB 17.1*  --   --   --  15.0  --  14.3  --  13.9  --  13.2  --   --   HCT 50.6  --   --   --  45.1  --  42.5  --  41.2  --  39.9  --   --   PLT 224  --   --   --  215  --  198  --  180  --  188  --   --   APTT  --   --  28  --   --   --   --   --   --   --   --   --   --   LABPROT  --   --  15.1  --   --   --   --   --   --   --   --   --   --   INR  --   --  1.17  --   --   --   --   --   --   --   --   --   --   HEPARINUNFRC  --   --   --   < >  --   < >  --   < >  --  0.41  --  <0.10* 0.35  CREATININE 1.21  --   --   --  1.40*  --   --   --   --   --   --   --   --   TROPONINI  --  0.25*  --   --  0.42*  --   --   --   --   --   --   --   --   < > = values in this interval not displayed.   Assessment: 59yo male remains therapeutic on heparin though trending down and at low end of goal.  Alteplase by EKOS is now complete, no time yet to return to IR.  Goal of Therapy:  Heparin level 0.3-0.7 units/ml   Plan:  Will increase heparin gtt slightly to 1800 units/hr and check level in 6hr.  Wynona Neat, PharmD, BCPS  02/03/2015,5:56 AM   ADDENDUM: RN called pharmacy to report oozing around sheath site; since heparin level was within goal will cancel rate change and continue at 1700 units/hr for now.  VB Wynona Neat, PharmD, BCPS 02/03/2015 6:45 AM

## 2015-02-03 NOTE — Progress Notes (Signed)
Notified Interventional Radiologist MD Levan Hurst in regards to bleeding at right femoral site.Bleeding did reoccur around 0600. I applied pressure per EKOS protocol. MD ordered to reinforce dressing and to monitor.

## 2015-02-03 NOTE — Progress Notes (Signed)
Pt stated "accidently massaged femoral sheath" causing slight bleeding around site. Followed protocol by applying pressure around site for 20 minutes. Bleeding around site has ceased. Will continue to monitor and assess.

## 2015-02-03 NOTE — Sedation Documentation (Signed)
Main Left pulmonary pressure 53/21 Mean 35

## 2015-02-03 NOTE — Sedation Documentation (Addendum)
Right pulmonary artery pressure 52/27 Mean 38

## 2015-02-04 DIAGNOSIS — I82403 Acute embolism and thrombosis of unspecified deep veins of lower extremity, bilateral: Secondary | ICD-10-CM

## 2015-02-04 DIAGNOSIS — I82409 Acute embolism and thrombosis of unspecified deep veins of unspecified lower extremity: Secondary | ICD-10-CM | POA: Insufficient documentation

## 2015-02-04 LAB — BASIC METABOLIC PANEL
Anion gap: 7 (ref 5–15)
BUN: 12 mg/dL (ref 6–23)
CO2: 25 mmol/L (ref 19–32)
Calcium: 8.4 mg/dL (ref 8.4–10.5)
Chloride: 107 mmol/L (ref 96–112)
Creatinine, Ser: 1.08 mg/dL (ref 0.50–1.35)
GFR, EST AFRICAN AMERICAN: 86 mL/min — AB (ref >90)
GFR, EST NON AFRICAN AMERICAN: 74 mL/min — AB (ref >90)
Glucose, Bld: 116 mg/dL — ABNORMAL HIGH (ref 70–99)
Potassium: 4 mmol/L (ref 3.5–5.1)
SODIUM: 139 mmol/L (ref 135–145)

## 2015-02-04 LAB — CBC WITH DIFFERENTIAL/PLATELET
BASOS ABS: 0.1 10*3/uL (ref 0.0–0.1)
Basophils Relative: 1 % (ref 0–1)
Eosinophils Absolute: 0.2 10*3/uL (ref 0.0–0.7)
Eosinophils Relative: 2 % (ref 0–5)
HCT: 38.6 % — ABNORMAL LOW (ref 39.0–52.0)
Hemoglobin: 12.7 g/dL — ABNORMAL LOW (ref 13.0–17.0)
LYMPHS PCT: 16 % (ref 12–46)
Lymphs Abs: 1.5 10*3/uL (ref 0.7–4.0)
MCH: 29.1 pg (ref 26.0–34.0)
MCHC: 32.9 g/dL (ref 30.0–36.0)
MCV: 88.3 fL (ref 78.0–100.0)
Monocytes Absolute: 0.9 10*3/uL (ref 0.1–1.0)
Monocytes Relative: 10 % (ref 3–12)
NEUTROS PCT: 71 % (ref 43–77)
Neutro Abs: 6.8 10*3/uL (ref 1.7–7.7)
PLATELETS: 187 10*3/uL (ref 150–400)
RBC: 4.37 MIL/uL (ref 4.22–5.81)
RDW: 13.6 % (ref 11.5–15.5)
WBC: 9.6 10*3/uL (ref 4.0–10.5)

## 2015-02-04 LAB — MAGNESIUM: MAGNESIUM: 2.1 mg/dL (ref 1.5–2.5)

## 2015-02-04 LAB — PHOSPHORUS: Phosphorus: 3.5 mg/dL (ref 2.3–4.6)

## 2015-02-04 NOTE — Progress Notes (Signed)
Subjective: Continues to do well, with no increased wob.  Walking around room.  Has been started on xarelto.   Objective: Vital signs in last 24 hours: Blood pressure 144/82, pulse 92, temperature 98.6 F (37 C), temperature source Oral, resp. rate 18, height 6\' 2"  (1.88 m), weight 101.5 kg (223 lb 12.3 oz), SpO2 97 %.  Intake/Output from previous day: 04/22 0701 - 04/23 0700 In: 2157.7 [P.O.:1005; I.V.:1152.7] Out: 1100 [Urine:1100]   Physical Exam:   wd male in nad Nose without purulence or d/c noted Neck without LN or TMG Chest totally clear to auscultation Cor with rrr, 2/6 sem abd benign LE with mild edema, no cyanosis Alert and oriented, moves all 4.    Lab Results:  Recent Labs  02/03/15 0115 02/03/15 0654 02/04/15 0430  WBC 11.8* 11.5* 9.6  HGB 13.2 13.1 12.7*  HCT 39.9 39.1 38.6*  PLT 188 158 187   BMET  Recent Labs  02/02/15 0015 02/03/15 0654 02/04/15 0430  NA 138 137 139  K 5.0 4.0 4.0  CL 104 109 107  CO2 23 20 25   GLUCOSE 191* 110* 116*  BUN 18 15 12   CREATININE 1.40* 0.98 1.08  CALCIUM 8.5 8.1* 8.4      Assessment/Plan:  PULMONARY A:  Acute Submassive PE s/p EKOS, +bilat DVT P:  -continue xarelto -ambulate in halls tomorrow if remains stable.   CARDIOVASCULAR A:  Acute PE as above Bilateral LE DVTs NSTEMI 2/2 PE HTN P:  Currently normotensive, consider restarting home lisinopril if hypertensive    Kathee Delton, M.D. 02/04/2015, 1:01 PM

## 2015-02-04 NOTE — Progress Notes (Signed)
Referring Physician(s): PCCM  Subjective:  B PE Post lysis 4/21-22 Up in bed Doing well Breathing easier  Allergies: Review of patient's allergies indicates no known allergies.  Medications: Prior to Admission medications   Medication Sig Start Date End Date Taking? Authorizing Provider  buPROPion (WELLBUTRIN) 100 MG tablet Take 100 mg by mouth 3 (three) times daily.  01/24/15  Yes Historical Provider, MD  ibuprofen (ADVIL,MOTRIN) 200 MG tablet Take 600 mg by mouth every 6 (six) hours as needed (for pain).   Yes Historical Provider, MD  lisinopril (PRINIVIL,ZESTRIL) 10 MG tablet Take 10 mg by mouth at bedtime.  01/24/15  Yes Historical Provider, MD     Vital Signs: BP 144/82 mmHg  Pulse 92  Temp(Src) 98.6 F (37 C) (Oral)  Resp 18  Ht 6\' 2"  (1.88 m)  Wt 101.5 kg (223 lb 12.3 oz)  BMI 28.72 kg/m2  SpO2 97%  Physical Exam  Cardiovascular: Normal rate and regular rhythm.   Pulmonary/Chest: Effort normal and breath sounds normal. He has no wheezes.  Skin:  Rt groin NT no bleeding No hematoma  Nursing note and vitals reviewed.   Imaging: Ct Angio Chest Pe W/cm &/or Wo Cm  02/02/2015   CLINICAL DATA:  Chest tightness and discomfort. Dizziness and fatigue. Two syncopal episodes.  EXAM: CT ANGIOGRAPHY CHEST WITH CONTRAST  TECHNIQUE: Multidetector CT imaging of the chest was performed using the standard protocol during bolus administration of intravenous contrast. Multiplanar CT image reconstructions and MIPs were obtained to evaluate the vascular anatomy.  CONTRAST:  172mL OMNIPAQUE IOHEXOL 350 MG/ML SOLN  COMPARISON:  Chest radiograph 02/01/2015.  FINDINGS: Mediastinum: Massive BILATERAL lobar pulmonary emboli. Saddle embolus straddles the origins of the RIGHT and LEFT pulmonary arteries across the midline as seen on image 43 series 4. Heart size is increased. Significant RIGHT heart strain with RV/LV ratio of 2.0 (51/25.5 mm ratio). No pericardial fluid, thickening or  calcification. No acute abnormality of the thoracic aorta or other great vessels of the mediastinum. No pathologically enlarged mediastinal or hilar lymph nodes. The esophagus is normal in appearance.  Lungs/Pleura: No consolidative airspace disease. No pleural effusions. No suspicious appearing pulmonary nodules or masses.  Upper Abdomen: Visualized portions of the upper abdomen are unremarkable.  Musculoskeletal: No aggressive appearing lytic or blastic lesions are noted in the visualized portions of the skeleton.  Review of the MIP images confirms the above findings.  IMPRESSION: Positive for acute PE with CT evidence of right heart strain (RV/LV Ratio = 2.0) consistent with at least submassive (intermediate risk) PE. The presence of right heart strain has been associated with an increased risk of morbidity and mortality. Please activate Code PE by paging [phone]. Findings discussed with ordering provider.   Electronically Signed   By: Rolla Flatten M.D.   On: 02/02/2015 08:27   Ir Angiogram Pulmonary Bilateral Selective  02/02/2015   CLINICAL DATA:  Acute sub massive bilateral pulmonary emboli with evidence of right heart strain. Persistent labored breathing and shortness of breath.  EXAM: ULTRASOUND GUIDANCE FOR VASCULAR ACCESS  BILATERAL PULMONARY ARTERIAL CATHETERIZATIONS AND ANGIOGRAMS  INITIATION OF BILATERAL ULTRASOUND ASSISTED TRANSCATHETER PULMONARY EMBOLUS THROMBOLYSIS (EKOS)  Date:  4/21/20164/21/2016 1:05 pm  Radiologist:  M. Daryll Brod, MD  Guidance:  Ultrasound and fluoroscopic  FLUOROSCOPY TIME:  20 minutes 12 seconds, 552 mGy  MEDICATIONS AND MEDICAL HISTORY: 2 mg Versed, 75 mcg fentanyl  ANESTHESIA/SEDATION: 40 minutes  CONTRAST:  56mL OMNIPAQUE IOHEXOL 300 MG/ML  SOLN  COMPLICATIONS: None  immediate  PROCEDURE: Informed consent was obtained from the patient following explanation of the procedure, risks, benefits and alternatives. The patient understands, agrees and consents for the  procedure. All questions were addressed. A time out was performed.  Maximal barrier sterile technique utilized including caps, mask, sterile gowns, sterile gloves, large sterile drape, hand hygiene, and Betadine.  Under sterile conditions and local anesthesia, ultrasound micropuncture access performed of the right common femoral vein. Images obtained for documentation. Two tandem punctures were performed of the right common femoral vein for insertion of 2 6 French sheaths over guidewires.  Catheter and guidewire access was advanced into the right atrium. The vert catheter and glide wire were manipulated into the left pulmonary artery initially.  Initial left pulmonary artery pressure obtained:  45/10 (24).  Guidewire and catheter access were advanced into a lower lobe branch of the left pulmonary vascularity. Selective left pulmonary angiogram confirms position of the access. Central filling defect present compatible with the known extensive bilateral pulmonary emboli.  Next, the second catheter and guidewire were manipulated in a similar fashion into the right lower lobe pulmonary artery. Right lower lobe pulmonary angiogram confirms position. Large filling defects present in the right lower lobe pulmonary artery compatible with known pulmonary emboli.  Over Rosen guidewires, EKOS ultrasound assisted infusion catheters were advanced into the pulmonary arteries bilaterally. Left infusion catheter has a 12 cm length and a right infusion catheter 18 cm length. Position confirmed with fluoroscopy. Images obtained for documentation. Access secured externally at the right groin site.  Transcatheter ultrasound assisted thrombolysis will be initiated at 1 milligram/hour per catheter for 12 hours (total dose 24 mg). At that time, repeat pulmonary artery pressure will be obtained.  IMPRESSION: Bilateral pulmonary arterial angiograms positive for extensive bilateral pulmonary emboli as previously documented on the CTA.   Initial left pulmonary artery pressure 45/10 mm Hg  Successful insertion of bilateral EKOS ultrasound assisted infusion catheters to begin PE lysis.   Electronically Signed   By: Jerilynn Mages.  Shick M.D.   On: 02/02/2015 14:18   Ir US Guide Vasc Access Right  02/02/2015   CLINICAL DATA:  Acute sub massive bilateral pulmonary emboli with evidence of right heart strain. Persistent labored breathing and shortness of breath.  EXAM: ULTRASOUND GUIDANCE FOR VASCULAR ACCESS  BILATERAL PULMONARY ARTERIAL CATHETERIZATIONS AND ANGIOGRAMS  INITIATION OF BILATERAL ULTRASOUND ASSISTED TRANSCATHETER PULMONARY EMBOLUS THROMBOLYSIS (EKOS)  Date:  4/21/20164/21/2016 1:05 pm  Radiologist:  M. Daryll Brod, MD  Guidance:  Ultrasound and fluoroscopic  FLUOROSCOPY TIME:  20 minutes 12 seconds, 552 mGy  MEDICATIONS AND MEDICAL HISTORY: 2 mg Versed, 75 mcg fentanyl  ANESTHESIA/SEDATION: 40 minutes  CONTRAST:  55mL OMNIPAQUE IOHEXOL 300 MG/ML  SOLN  COMPLICATIONS: None immediate  PROCEDURE: Informed consent was obtained from the patient following explanation of the procedure, risks, benefits and alternatives. The patient understands, agrees and consents for the procedure. All questions were addressed. A time out was performed.  Maximal barrier sterile technique utilized including caps, mask, sterile gowns, sterile gloves, large sterile drape, hand hygiene, and Betadine.  Under sterile conditions and local anesthesia, ultrasound micropuncture access performed of the right common femoral vein. Images obtained for documentation. Two tandem punctures were performed of the right common femoral vein for insertion of 2 6 French sheaths over guidewires.  Catheter and guidewire access was advanced into the right atrium. The vert catheter and glide wire were manipulated into the left pulmonary artery initially.  Initial left pulmonary artery pressure obtained:  45/10 (24).  Guidewire and catheter access were advanced into a lower lobe branch of the left  pulmonary vascularity. Selective left pulmonary angiogram confirms position of the access. Central filling defect present compatible with the known extensive bilateral pulmonary emboli.  Next, the second catheter and guidewire were manipulated in a similar fashion into the right lower lobe pulmonary artery. Right lower lobe pulmonary angiogram confirms position. Large filling defects present in the right lower lobe pulmonary artery compatible with known pulmonary emboli.  Over Rosen guidewires, EKOS ultrasound assisted infusion catheters were advanced into the pulmonary arteries bilaterally. Left infusion catheter has a 12 cm length and a right infusion catheter 18 cm length. Position confirmed with fluoroscopy. Images obtained for documentation. Access secured externally at the right groin site.  Transcatheter ultrasound assisted thrombolysis will be initiated at 1 milligram/hour per catheter for 12 hours (total dose 24 mg). At that time, repeat pulmonary artery pressure will be obtained.  IMPRESSION: Bilateral pulmonary arterial angiograms positive for extensive bilateral pulmonary emboli as previously documented on the CTA.  Initial left pulmonary artery pressure 45/10 mm Hg  Successful insertion of bilateral EKOS ultrasound assisted infusion catheters to begin PE lysis.   Electronically Signed   By: Jerilynn Mages.  Shick M.D.   On: 02/02/2015 14:18   Dg Chest Port 1 View  02/01/2015   CLINICAL DATA:  Shortness of breath and lightheadedness  EXAM: PORTABLE CHEST - 1 VIEW  COMPARISON:  None.  FINDINGS: Normal heart size and mediastinal contours. No acute infiltrate or edema. No effusion or pneumothorax. No acute osseous findings.  IMPRESSION: Negative portable chest.   Electronically Signed   By: Monte Fantasia M.D.   On: 02/01/2015 22:00   Dg Chest Portable 1 View  02/01/2015   CLINICAL DATA:  Chest pain and short of breath  EXAM: PORTABLE CHEST - 1 VIEW  COMPARISON:  None.  FINDINGS: The heart size and mediastinal  contours are within normal limits. Both lungs are clear. The visualized skeletal structures are unremarkable.  IMPRESSION: No active disease.   Electronically Signed   By: Franchot Gallo M.D.   On: 02/01/2015 14:30   Ir Infusion Thrombol Arterial Initial (ms)  02/02/2015   CLINICAL DATA:  Acute sub massive bilateral pulmonary emboli with evidence of right heart strain. Persistent labored breathing and shortness of breath.  EXAM: ULTRASOUND GUIDANCE FOR VASCULAR ACCESS  BILATERAL PULMONARY ARTERIAL CATHETERIZATIONS AND ANGIOGRAMS  INITIATION OF BILATERAL ULTRASOUND ASSISTED TRANSCATHETER PULMONARY EMBOLUS THROMBOLYSIS (EKOS)  Date:  4/21/20164/21/2016 1:05 pm  Radiologist:  M. Daryll Brod, MD  Guidance:  Ultrasound and fluoroscopic  FLUOROSCOPY TIME:  20 minutes 12 seconds, 552 mGy  MEDICATIONS AND MEDICAL HISTORY: 2 mg Versed, 75 mcg fentanyl  ANESTHESIA/SEDATION: 40 minutes  CONTRAST:  43mL OMNIPAQUE IOHEXOL 300 MG/ML  SOLN  COMPLICATIONS: None immediate  PROCEDURE: Informed consent was obtained from the patient following explanation of the procedure, risks, benefits and alternatives. The patient understands, agrees and consents for the procedure. All questions were addressed. A time out was performed.  Maximal barrier sterile technique utilized including caps, mask, sterile gowns, sterile gloves, large sterile drape, hand hygiene, and Betadine.  Under sterile conditions and local anesthesia, ultrasound micropuncture access performed of the right common femoral vein. Images obtained for documentation. Two tandem punctures were performed of the right common femoral vein for insertion of 2 6 French sheaths over guidewires.  Catheter and guidewire access was advanced into the right atrium. The vert catheter and glide wire were manipulated into the left pulmonary  artery initially.  Initial left pulmonary artery pressure obtained:  45/10 (24).  Guidewire and catheter access were advanced into a lower lobe branch of  the left pulmonary vascularity. Selective left pulmonary angiogram confirms position of the access. Central filling defect present compatible with the known extensive bilateral pulmonary emboli.  Next, the second catheter and guidewire were manipulated in a similar fashion into the right lower lobe pulmonary artery. Right lower lobe pulmonary angiogram confirms position. Large filling defects present in the right lower lobe pulmonary artery compatible with known pulmonary emboli.  Over Rosen guidewires, EKOS ultrasound assisted infusion catheters were advanced into the pulmonary arteries bilaterally. Left infusion catheter has a 12 cm length and a right infusion catheter 18 cm length. Position confirmed with fluoroscopy. Images obtained for documentation. Access secured externally at the right groin site.  Transcatheter ultrasound assisted thrombolysis will be initiated at 1 milligram/hour per catheter for 12 hours (total dose 24 mg). At that time, repeat pulmonary artery pressure will be obtained.  IMPRESSION: Bilateral pulmonary arterial angiograms positive for extensive bilateral pulmonary emboli as previously documented on the CTA.  Initial left pulmonary artery pressure 45/10 mm Hg  Successful insertion of bilateral EKOS ultrasound assisted infusion catheters to begin PE lysis.   Electronically Signed   By: Jerilynn Mages.  Shick M.D.   On: 02/02/2015 14:18   Ir Jacolyn Reedy F/u Eval Art/ven Final Day (ms)  02/03/2015   CLINICAL DATA:  Bilateral pulmonary emboli and follow-up ultrasound-assistedcatheter directed thrombolysis.  EXAM: FOLLOW-UP THROMBOLYTIC THERAPY WITH PRESSURE MEASUREMENTS.  Physician: Stephan Minister. Henn, MD  FLUOROSCOPY TIME:  24 seconds, 7.6 mGy  MEDICATIONS: None  ANESTHESIA/SEDATION: Moderate sedation time: None  PROCEDURE: Patient was placed supine on the interventional table. Position of the catheters was identified. The left pulmonary artery catheter was attached to the pressure transducer. Pressures were  obtained in the left pulmonary artery. Catheter was pulled back under fluoroscopy. The entire catheter was removed. Similarly, pressures were obtained from the right pulmonary artery catheter as it was pulled into the main pulmonary artery. The entire right pulmonary catheter was removed. Both vascular sheaths were removed with manual compression. Bandages were placed at the puncture sites.  FINDINGS: Bilateral pulmonary artery catheters were unchanged in position. Pressure in the left pulmonary artery was 51/22 mmHg, mean 34. Pressure in the right pulmonary artery was 52/27 mmHg, mean 38. Main pulmonary artery pressure was 47/33 mmHg, mean 38.  COMPLICATIONS: None  IMPRESSION: Completion of catheter-directed ultrasound-assisted thrombolytic therapy in the pulmonary arteries. Pulmonary artery pressures remain elevated.   Electronically Signed   By: Markus Daft M.D.   On: 02/03/2015 15:55    Labs:  CBC:  Recent Labs  02/02/15 1854 02/03/15 0115 02/03/15 0654 02/04/15 0430  WBC 12.7* 11.8* 11.5* 9.6  HGB 13.9 13.2 13.1 12.7*  HCT 41.2 39.9 39.1 38.6*  PLT 180 188 158 187    COAGS:  Recent Labs  02/01/15 1532  INR 1.17  APTT 28    BMP:  Recent Labs  02/01/15 1201 02/02/15 0015 02/03/15 0654 02/04/15 0430  NA 137 138 137 139  K 4.8 5.0 4.0 4.0  CL 106 104 109 107  CO2 21 23 20 25   GLUCOSE 167* 191* 110* 116*  BUN 19 18 15 12   CALCIUM 9.1 8.5 8.1* 8.4  CREATININE 1.21 1.40* 0.98 1.08  GFRNONAA 64* 54* 89* 74*  GFRAA 75* 63* >90 86*    LIVER FUNCTION TESTS:  Recent Labs  02/02/15 0015  BILITOT 1.7*  AST 27  ALT 22  ALKPHOS 73  PROT 6.4  ALBUMIN 3.3*    Assessment and Plan:  Post PE lysis 4/21-22 Up in room No complaints Plan per CCM and TRH Answered all questions to satisfaction  Signed: Latarra Eagleton A 02/04/2015, 12:26 PM   I spent a total of 15 Minutes in face to face in clinical consultation/evaluation, greater than 50% of which was  counseling/coordinating care for PE lysis

## 2015-02-05 LAB — BASIC METABOLIC PANEL
Anion gap: 8 (ref 5–15)
BUN: 12 mg/dL (ref 6–23)
CO2: 27 mmol/L (ref 19–32)
Calcium: 8.6 mg/dL (ref 8.4–10.5)
Chloride: 106 mmol/L (ref 96–112)
Creatinine, Ser: 0.99 mg/dL (ref 0.50–1.35)
GFR calc Af Amer: >90 mL/min (ref >90)
GFR calc non Af Amer: 88 mL/min — ABNORMAL LOW (ref >90)
GLUCOSE: 115 mg/dL — AB (ref 70–99)
POTASSIUM: 4.1 mmol/L (ref 3.5–5.1)
SODIUM: 141 mmol/L (ref 135–145)

## 2015-02-05 LAB — CBC WITH DIFFERENTIAL/PLATELET
BASOS ABS: 0.1 10*3/uL (ref 0.0–0.1)
BASOS PCT: 1 % (ref 0–1)
EOS PCT: 4 % (ref 0–5)
Eosinophils Absolute: 0.4 10*3/uL (ref 0.0–0.7)
HEMATOCRIT: 39.2 % (ref 39.0–52.0)
HEMOGLOBIN: 13.1 g/dL (ref 13.0–17.0)
LYMPHS PCT: 14 % (ref 12–46)
Lymphs Abs: 1.2 10*3/uL (ref 0.7–4.0)
MCH: 29.6 pg (ref 26.0–34.0)
MCHC: 33.4 g/dL (ref 30.0–36.0)
MCV: 88.7 fL (ref 78.0–100.0)
MONOS PCT: 9 % (ref 3–12)
Monocytes Absolute: 0.7 10*3/uL (ref 0.1–1.0)
NEUTROS ABS: 6.2 10*3/uL (ref 1.7–7.7)
Neutrophils Relative %: 72 % (ref 43–77)
Platelets: 224 10*3/uL (ref 150–400)
RBC: 4.42 MIL/uL (ref 4.22–5.81)
RDW: 13.5 % (ref 11.5–15.5)
WBC: 8.6 10*3/uL (ref 4.0–10.5)

## 2015-02-05 LAB — PHOSPHORUS: PHOSPHORUS: 4.4 mg/dL (ref 2.3–4.6)

## 2015-02-05 LAB — MAGNESIUM: Magnesium: 2.3 mg/dL (ref 1.5–2.5)

## 2015-02-05 NOTE — Progress Notes (Signed)
Subjective: No issues overnight, no increased wob or chest discomfort.   Objective: Vital signs in last 24 hours: Blood pressure 147/83, pulse 91, temperature 98.4 F (36.9 C), temperature source Oral, resp. rate 18, height 6\' 2"  (1.88 m), weight 220 lb 14.4 oz (100.2 kg), SpO2 95 %.  Intake/Output from previous day: 04/23 0701 - 04/24 0700 In: 58 [P.O.:816] Out: -    Physical Exam:   wd male in nad Nose without purulence or d/c noted Neck without LN or TMG Chest totally clear to auscultation Cor with rrr, 2/6 sem abd benign LE with mild edema, no cyanosis Alert and oriented, moves all 4.    Lab Results:  Recent Labs  02/03/15 0654 02/04/15 0430 02/05/15 0428  WBC 11.5* 9.6 8.6  HGB 13.1 12.7* 13.1  HCT 39.1 38.6* 39.2  PLT 158 187 224   BMET  Recent Labs  02/03/15 0654 02/04/15 0430 02/05/15 0428  NA 137 139 141  K 4.0 4.0 4.1  CL 109 107 106  CO2 20 25 27   GLUCOSE 110* 116* 115*  BUN 15 12 12   CREATININE 0.98 1.08 0.99  CALCIUM 8.1* 8.4 8.6      Assessment/Plan:  PULMONARY A:  Acute Submassive PE s/p EKOS, +bilat DVT P:  -continue xarelto -start ambulating in halls today.  ?d/c in am?  CARDIOVASCULAR A:  Acute PE as above Bilateral LE DVTs NSTEMI 2/2 PE HTN P:  Minimally elevated BP.  ?restart home BP meds at discharge?    Kathee Delton, M.D. 02/05/2015, 2:09 PM

## 2015-02-06 LAB — CBC WITH DIFFERENTIAL/PLATELET
BASOS PCT: 1 % (ref 0–1)
Basophils Absolute: 0.1 10*3/uL (ref 0.0–0.1)
Eosinophils Absolute: 0.4 10*3/uL (ref 0.0–0.7)
Eosinophils Relative: 5 % (ref 0–5)
HCT: 41.1 % (ref 39.0–52.0)
Hemoglobin: 13.9 g/dL (ref 13.0–17.0)
LYMPHS ABS: 1.4 10*3/uL (ref 0.7–4.0)
Lymphocytes Relative: 16 % (ref 12–46)
MCH: 29.8 pg (ref 26.0–34.0)
MCHC: 33.8 g/dL (ref 30.0–36.0)
MCV: 88.2 fL (ref 78.0–100.0)
Monocytes Absolute: 0.9 10*3/uL (ref 0.1–1.0)
Monocytes Relative: 9 % (ref 3–12)
Neutro Abs: 6.3 10*3/uL (ref 1.7–7.7)
Neutrophils Relative %: 69 % (ref 43–77)
PLATELETS: 282 10*3/uL (ref 150–400)
RBC: 4.66 MIL/uL (ref 4.22–5.81)
RDW: 13.5 % (ref 11.5–15.5)
WBC: 9.1 10*3/uL (ref 4.0–10.5)

## 2015-02-06 LAB — BASIC METABOLIC PANEL
Anion gap: 10 (ref 5–15)
BUN: 11 mg/dL (ref 6–23)
CHLORIDE: 101 mmol/L (ref 96–112)
CO2: 24 mmol/L (ref 19–32)
CREATININE: 1.04 mg/dL (ref 0.50–1.35)
Calcium: 8.3 mg/dL — ABNORMAL LOW (ref 8.4–10.5)
GFR calc Af Amer: 90 mL/min — ABNORMAL LOW (ref >90)
GFR calc non Af Amer: 77 mL/min — ABNORMAL LOW (ref >90)
Glucose, Bld: 109 mg/dL — ABNORMAL HIGH (ref 70–99)
Potassium: 3.8 mmol/L (ref 3.5–5.1)
SODIUM: 135 mmol/L (ref 135–145)

## 2015-02-06 LAB — PHOSPHORUS: Phosphorus: 3.6 mg/dL (ref 2.3–4.6)

## 2015-02-06 LAB — MAGNESIUM: MAGNESIUM: 2.3 mg/dL (ref 1.5–2.5)

## 2015-02-06 MED ORDER — PANTOPRAZOLE SODIUM 40 MG PO TBEC: 40.0000 mg | DELAYED_RELEASE_TABLET | Freq: Every day | ORAL | Status: DC

## 2015-02-06 MED ORDER — RIVAROXABAN 20 MG PO TABS: 20.0000 mg | ORAL_TABLET | Freq: Every day | ORAL | Status: DC

## 2015-02-06 MED ORDER — RIVAROXABAN 15 MG PO TABS: 15.0000 mg | ORAL_TABLET | Freq: Two times a day (BID) | ORAL | Status: DC

## 2015-02-06 MED ORDER — NONFORMULARY OR COMPOUNDED ITEM: Status: AC

## 2015-02-06 NOTE — Discharge Summary (Signed)
Physician Discharge Summary       Patient ID: Jonathan Rios MRN: [id number] DOB/AGE: 02/16/1956 59 y.o.  Admit date: 02/01/2015 Discharge date: 02/06/2015  Discharge Diagnoses:  Acute Submassive Pulmonary Emboli  Bilateral Deep Vein Thrombosis  S/p EKOS Elevated Troponin  HTN  Detailed Hospital Course:  This is a 59 year old male that was admitted on 4/20 w/ acute onset of chest pain starting earlier that morning. His pain was initially substernal and described as mild in nature. He had associated light-headedness. Went to work. Ultimately went to ER later that day as pain did not subside. His pain initially responded to SL NTG. In ER he had elevated Trop I at 0.25. He was initially admitted w/ working dx of NQWMI per cardiology. Started on Heparin gtt w/ plans for cardiac catheterization. During the night after admission he had an episode where he felt dizzy and gradually slumped to the floor. His ECG was unchanged. He was noted to be mildly hypotensive w/ BP 80s/40s. Trop rose to 0.49. His D dimer returned as positive. He went for CT of chest because of his D Dimer findings and this did indeed confirm Submassive Pulmonary Embolism w/ CT evidence of RV strain on 4/21. Pulmonary was consulted. He had overnight PESI score CLass 5 - highest risk score with hypotension, tachycardia and mild hypoxemia. The AM on 4/21with > 12h of IV heparin - improved to class 3 with resolution of hypotension and hypoxemia but subjectively reporting only minimal imprvement and desire for more aggressive Rx. We felt that he was not a candidate for systemic thrombolysis but did meet criteria for EKOS.  He underwent EKOS catheterization on 4/21 initial PA pressure 45/10. He was initiated on a 24mg  infusion to be given over 12 hours. In the mean time his Bilateral LE ultrasound did show DVT in the Left:popliteal, gastrocnemius, and peroneal veins. He completed his 12 hour infusion and went for repeat PA readings in  interventional radiology. His Left PA pressures remained elevated at 51/22 but his symptom burden had improved. Because of this his TPA infusion was stopped. He was started on Xarelto on 4/22. Monitored in the hospital over the next 48 hours. He was deemed ready for d/c as of 4/25 with the following plan of care as outlined below.    Discharge Plan by active problems   Acute Submassive PE s/p EKOS, + bilateral DVT. Not provoked.  Plan:  - continue xarelto - f/u echo in clinic - consider hypercoagulable panel   - cont PPI for [facility] tests/ studies   Consults: interventional radiology; cardiology  4/21 CT chest: Positive for acute PE with CT evidence of right heart strain (RV/LV Ratio = 2.0) consistent with at least submassive (intermediate risk) PE.   4/21 IR angiogram: Bilateral pulmonary arterial angiograms positive for extensive bilateral pulmonary emboli as previously documented on the CTA. Initial left pulmonary artery pressure 45/10 mm Hg  4/21 Lower extremity dopplers: Findings consistent with acute deep vein thrombosis involving the left popliteal, gastrocnemius, and peroneal veins. Superficialthrombosis is noted in the left greater saphenous vein. - Findings consistent with acute deep vein thrombosis involving the right peroneal vein. Other specific details can be found in the table(s) above. Prepared and Electronically Authenticated by  4/22 Repeat angiogram: Completion of catheter-directed ultrasound-assisted thrombolytic therapy in the pulmonary arteries. Pulmonary artery pressures remain Elevated.  4/22 ECHO: Left ventricle: The cavity size was normal. Wall thickness wasincreased in a pattern of mild LVH.  Systolic function was normal.The estimated ejection fraction was in the range of 60% to 65%. Wall motion was normal; there were no regional wall motion abnormalities. Left ventricular diastolic function parameters were normal. -  Right ventricle: The cavity size was mildly dilated. - Right atrium: The atrium was mildly dilated.  Discharge Exam: BP 152/92 mmHg  Pulse 84  Temp(Src) 98.5 F (36.9 C) (Oral)  Resp 19  Ht 6\' 2"  (1.88 m)  Wt 99.338 kg (219 lb)  BMI 28.11 kg/m2  SpO2 94%  Room air   wd male in nad Nose without purulence or d/c noted Neck without LN or TMG Chest totally clear to auscultation Cor with rrr, 2/6 sem abd benign LE with mild edema, no cyanosis Alert and oriented, moves all 4.   Labs at discharge Lab Results  Component Value Date   CREATININE 1.04 02/06/2015   BUN 11 02/06/2015   NA 135 02/06/2015   K 3.8 02/06/2015   CL 101 02/06/2015   CO2 24 02/06/2015   Lab Results  Component Value Date   WBC 9.1 02/06/2015   HGB 13.9 02/06/2015   HCT 41.1 02/06/2015   MCV 88.2 02/06/2015   PLT 282 02/06/2015   Lab Results  Component Value Date   ALT 22 02/02/2015   AST 27 02/02/2015   ALKPHOS 73 02/02/2015   BILITOT 1.7* 02/02/2015   Lab Results  Component Value Date   INR 1.17 02/01/2015    Current radiology studies No results found.  Disposition:  Final discharge disposition not confirmed      Discharge Instructions    Diet - low sodium heart healthy    Complete by:  As directed      Discharge instructions    Complete by:  As directed   Wear your compression stockings daily May return to work after being cleared at your follow up visit next week No lifting > 10 lbs in next week.     Increase activity slowly    Complete by:  As directed             Medication List    TAKE these medications        buPROPion 100 MG tablet  Commonly known as:  WELLBUTRIN  Take 100 mg by mouth 3 (three) times daily.     ibuprofen 200 MG tablet  Commonly known as:  ADVIL,MOTRIN  Take 600 mg by mouth every 6 (six) hours as needed (for pain).     lisinopril 10 MG tablet  Commonly known as:  PRINIVIL,ZESTRIL  Take 10 mg by mouth at bedtime.     NONFORMULARY OR  COMPOUNDED ITEM  - Graduated Compression Hose  - Knee High   - 30-40 mm Hg compression     pantoprazole 40 MG tablet  Commonly known as:  PROTONIX  Take 1 tablet (40 mg total) by mouth daily at 12 noon.     Rivaroxaban 15 MG Tabs tablet  Commonly known as:  XARELTO  Take 1 tablet (15 mg total) by mouth 2 (two) times daily with a meal.     rivaroxaban 20 MG Tabs tablet  Commonly known as:  XARELTO  Take 1 tablet (20 mg total) by mouth daily. Start this after completing the 15 mg twice a day  Start taking on:  02/24/2015         Discharged Condition: good  Physician Statement:   The Patient was personally examined, the discharge assessment and plan has been personally reviewed and  I agree with ACNP Aamir Mclinden's assessment and plan. > 30 minutes of time have been dedicated to discharge assessment, planning and discharge instructions.   Signed: Vannesa Abair,PETE 02/06/2015, 11:24 AM

## 2015-02-06 NOTE — Progress Notes (Signed)
02/06/2015 12:48 PM Discharge AVS meds taken today and those due this evening reviewed.  Follow-up appointments and when to call md reviewed.  D/C IV and TELE.  Questions and concerns addressed.   D/C home per orders. [place]

## 2015-02-17 ENCOUNTER — Encounter: Payer: Self-pay | Admitting: Adult Health

## 2015-02-17 ENCOUNTER — Ambulatory Visit (INDEPENDENT_AMBULATORY_CARE_PROVIDER_SITE_OTHER): Payer: Managed Care, Other (non HMO) | Admitting: Adult Health

## 2015-02-17 VITALS — BP 118/72 | HR 83 | Temp 98.0°F | Ht 74.0 in | Wt 219.2 lb

## 2015-02-17 DIAGNOSIS — I2699 Other pulmonary embolism without acute cor pulmonale: Secondary | ICD-10-CM | POA: Diagnosis not present

## 2015-02-17 DIAGNOSIS — I82409 Acute embolism and thrombosis of unspecified deep veins of unspecified lower extremity: Secondary | ICD-10-CM

## 2015-02-17 NOTE — Patient Instructions (Signed)
Continue on Xarelto .  Transition to Xarelto 20mg  daily when finished with starter pack  Follow up with Dr. Lake Bells in 2 weeks as planned Please contact office for sooner follow up if symptoms do not improve or worsen or seek emergency care

## 2015-02-24 NOTE — Assessment & Plan Note (Signed)
Continue on Xarelto .  Transition to Xarelto 20mg  daily when finished with starter pack  Follow up with Dr. Lake Bells in 2 weeks as planned Please contact office for sooner follow up if symptoms do not improve or worsen or seek emergency care

## 2015-02-24 NOTE — Progress Notes (Signed)
   Subjective:    Patient ID: Jonathan Rios, male    DOB: 1955/11/17, 59 y.o.   MRN: [id number]  HPI 59 yo male   02/17/15 Follow up  Patient presents for a post hospital follow-up. She was admitted 73 through 02/06/2015 for acute submassive pulmonary emboli and bilateral DVTs. CT chest showed submassive PE with right heart strain. Venous Dopplers were positive for bilateral DVTs. 2-D echo showed an EF of 60-65%, right ventricle and right atrium mildly dilated. He underwent EKOS catheterization. He was treated with Xarelto and discharged on a starter pack. He begins rivaroxaban 20 mg daily on May 13. Since discharge. He says he is doing well. He denies any flare of shortness of breath, cough or hemoptysis.     Review of Systems Constitutional:   No  weight loss, night sweats,  Fevers, chills, fatigue, or  lassitude.  HEENT:   No headaches,  Difficulty swallowing,  Tooth/dental problems, or  Sore throat,                No sneezing, itching, ear ache, nasal congestion, post nasal drip,   CV:  No chest pain,  Orthopnea, PND, swelling in lower extremities, anasarca, dizziness, palpitations, syncope.   GI  No heartburn, indigestion, abdominal pain, nausea, vomiting, diarrhea, change in bowel habits, loss of appetite, bloody stools.   Resp: No shortness of breath with exertion or at rest.  No excess mucus, no productive cough,  No non-productive cough,  No coughing up of blood.  No change in color of mucus.  No wheezing.  No chest wall deformity  Skin: no rash or lesions.  GU: no dysuria, change in color of urine, no urgency or frequency.  No flank pain, no hematuria   MS:  No joint pain or swelling.  No decreased range of motion.  No back pain.  Psych:  No change in mood or affect. No depression or anxiety.  No memory loss.         Objective:   Physical Exam GEN: A/Ox3; pleasant , NAD, well nourished   HEENT:  [place]/AT,  EACs-clear, TMs-wnl, NOSE-clear, THROAT-clear, no  lesions, no postnasal drip or exudate noted.   NECK:  Supple w/ fair ROM; no JVD; normal carotid impulses w/o bruits; no thyromegaly or nodules palpated; no lymphadenopathy.  RESP  Clear  P & A; w/o, wheezes/ rales/ or rhonchi.no accessory muscle use, no dullness to percussion  CARD:  RRR, no m/r/g  , no peripheral edema, pulses intact, no cyanosis or clubbing.  GI:   Soft & nt; nml bowel sounds; no organomegaly or masses detected.  Musco: Warm bil, no deformities or joint swelling noted.   Neuro: alert, no focal deficits noted.    Skin: Warm, no lesions or rashes         Assessment & Plan:

## 2015-03-08 ENCOUNTER — Ambulatory Visit (INDEPENDENT_AMBULATORY_CARE_PROVIDER_SITE_OTHER): Payer: Managed Care, Other (non HMO) | Admitting: Pulmonary Disease

## 2015-03-08 ENCOUNTER — Encounter: Payer: Self-pay | Admitting: Pulmonary Disease

## 2015-03-08 ENCOUNTER — Other Ambulatory Visit: Payer: Managed Care, Other (non HMO)

## 2015-03-08 VITALS — BP 144/92 | HR 83 | Ht 74.0 in | Wt 216.0 lb

## 2015-03-08 DIAGNOSIS — I2782 Chronic pulmonary embolism: Secondary | ICD-10-CM

## 2015-03-08 DIAGNOSIS — I2699 Other pulmonary embolism without acute cor pulmonale: Secondary | ICD-10-CM | POA: Diagnosis not present

## 2015-03-08 DIAGNOSIS — I82402 Acute embolism and thrombosis of unspecified deep veins of left lower extremity: Secondary | ICD-10-CM | POA: Diagnosis not present

## 2015-03-08 NOTE — Progress Notes (Signed)
Subjective:    Patient ID: Jonathan Rios, male    DOB: August 22, 1956, 59 y.o.   MRN: [id number]  Synopsis: This is a 59 year old male who came to the pulmonary clinic in May 2016 after hospitalization for an unprovoked pulmonary embolism in April 2016. His echocardiogram during the hospitalization showed mild RV dilatation as well as large bilateral pulmonary emboli and normal pulmonary parenchymal fields.  HPI Chief Complaint  Patient presents with  . Follow-up    Pt states breathing is doing well. Pt doing well with xarelto 20mg . States he has no new complaints.     This is a very pleasant 59 year old male who returns to our clinic for evaluation of a large unprovoked pulmonary embolism in April 2016.  He underwent EKOS cather directed thromoblysis while hospitalized. He did well after this procedure and said that he felt better nearly immediately afterwards.  He says that he has been taking Xarelto since hospital discharge and has been doing very well without dyspnea.  He denies chest pain.  His dyspnea has improved significantly.  Notably, he tells me that he felt dyspneic for weeks prior to this hospitalization. He also states that for years he has been experiencing some chronic left-sided thigh and buttock pain. He says that prior to the pulmonary embolism he traveled to [place] but he got out of the car several times. Past Medical History  Diagnosis Date  . Hypertension   . Depression   . Family history of adverse reaction to anesthesia     "Mom gets PONV; oldest daughter is not as sensitive to it as dosage would suggest;she  takes more than want they would expect"  . Non Q wave myocardial infarction 02/01/2015  . Sleep apnea     "not dx'd; family says I have it" (02/01/2015)  . GERD (gastroesophageal reflux disease)     hx  . Arthritis     "probably a little; finger joints" (02/01/2015)  . Dislocated cervical vertebra   . Tinnitus   . BPH (benign prostatic hypertrophy)    "treated for it and it got somewhat better"      Review of Systems  Constitutional: Negative for fever and unexpected weight change.  HENT: Negative for congestion, dental problem, ear pain, mouth sores, nosebleeds, postnasal drip, rhinorrhea, sinus pressure, sneezing and trouble swallowing.   Eyes: Negative for redness and itching.  Respiratory: Negative for cough, chest tightness, shortness of breath and wheezing.   Cardiovascular: Negative for palpitations and leg swelling.  Gastrointestinal: Negative for nausea and vomiting.  Genitourinary: Negative for dysuria.  Musculoskeletal: Negative for back pain.  Skin: Negative for rash.  Neurological: Negative for headaches.  Hematological: Does not bruise/bleed easily.  Psychiatric/Behavioral: Negative for dysphoric mood. The patient is not nervous/anxious.        Objective:   Physical Exam  Filed Vitals:   03/08/15 1428  BP: 144/92  Pulse: 83  Height: 6\' 2"  (1.88 m)  Weight: 216 lb (97.977 kg)  SpO2: 96%  RA  Gen: well appearing HENT: OP clear, TM's clear, neck supple PULM: CTA B, normal percussion CV: RRR, no mgr, trace edema GI: BS+, soft, nontender Derm: no cyanosis or rash Psyche: normal mood and affect    Hospitalization records from April 2016 reviewed where he was treated for an unprovoked large pulmonary embolism with catheter directed thrombolysis April 2016 CT chest images personally reviewed revealing normal pulmonary parenchyma but large bilateral pulmonary emboli with RV strain Echocardiogram from April 2016 shows normal LV  size and function mildly dilated RV and RA      Assessment & Plan:  Acute massive pulmonary embolism He had an unprovoked pulmonary embolism last month. I cannot find any convincing evidence based on his history that this was a provoked event. However, he notes some chronic left-sided low back by and buttock pain. It's not entirely clear to me what is causing this, though it sounds  musculoskeletal. I am going to contact my colleagues with radiology to see if they can suggest any testing that may shed light on whether or not there is mechanical obstruction of his femoral vein which may have led to this large DVT.  We are also going to test for genetic causes of hypercoagulability.  Plan: Continue anticoagulation for life considering the fact that he had an unprovoked pulmonary embolism Blood work today for prothrombin gene mutation, factor V Leiden, and anticardiolipin antibody Follow-up 6 months with echocardiogram and 6 minute walk   DVT (deep venous thrombosis) Per my conversation with radiology, it could be possible that he has May Thurner syndrome, but considering the fact that his leg looks good on exam today and he's doing well with anticoagulation there is no indication for further imaging at this time. If he has evidence of postphlebitic syndrome or if we have to stop anticoagulation then it may be worth a venogram at some point.      Current outpatient prescriptions:  .  buPROPion (WELLBUTRIN) 100 MG tablet, Take 100 mg by mouth 3 (three) times daily. , Disp: , Rfl:  .  ibuprofen (ADVIL,MOTRIN) 200 MG tablet, Take 600 mg by mouth every 6 (six) hours as needed (for pain)., Disp: , Rfl:  .  lisinopril (PRINIVIL,ZESTRIL) 10 MG tablet, Take 10 mg by mouth at bedtime. , Disp: , Rfl:  .  NONFORMULARY OR COMPOUNDED ITEM, Graduated Compression Hose Knee High  30-40 mm Hg compression, Disp: 1 each, Rfl: PRN .  pantoprazole (PROTONIX) 40 MG tablet, Take 1 tablet (40 mg total) by mouth daily at 12 noon., Disp: 30 tablet, Rfl: 6 .  rivaroxaban (XARELTO) 20 MG TABS tablet, Take 1 tablet (20 mg total) by mouth daily. Start this after completing the 15 mg twice a day, Disp: 30 tablet, Rfl: 6

## 2015-03-08 NOTE — Assessment & Plan Note (Signed)
He had an unprovoked pulmonary embolism last month. I cannot find any convincing evidence based on his history that this was a provoked event. However, he notes some chronic left-sided low back by and buttock pain. It's not entirely clear to me what is causing this, though it sounds musculoskeletal. I am going to contact my colleagues with radiology to see if they can suggest any testing that may shed light on whether or not there is mechanical obstruction of his femoral vein which may have led to this large DVT.  We are also going to test for genetic causes of hypercoagulability.  Plan: Continue anticoagulation for life considering the fact that he had an unprovoked pulmonary embolism Blood work today for prothrombin gene mutation, factor V Leiden, and anticardiolipin antibody Follow-up 6 months with echocardiogram and 6 minute walk

## 2015-03-08 NOTE — Assessment & Plan Note (Signed)
Per my conversation with radiology, it could be possible that he has May Thurner syndrome, but considering the fact that his leg looks good on exam today and he's doing well with anticoagulation there is no indication for further imaging at this time. If he has evidence of postphlebitic syndrome or if we have to stop anticoagulation then it may be worth a venogram at some point.

## 2015-03-08 NOTE — Patient Instructions (Addendum)
Keep taking Xarelto daily as you're doing Let us know if they have problems with bleeding Stay active We will call you with the results of today's blood work We will have you come back in October 2016 and will perform an echocardiogram in 6 minute walk test at that point

## 2015-03-10 LAB — CARDIOLIPIN ANTIBODY: PHOSPHOLIPIDS: 233 mg/dL (ref 151–264)

## 2015-03-12 LAB — FACTOR 5 LEIDEN

## 2015-03-12 LAB — PROTHROMBIN GENE MUTATION

## 2015-03-13 ENCOUNTER — Encounter: Payer: Self-pay | Admitting: Pulmonary Disease

## 2015-03-13 DIAGNOSIS — D6851 Activated protein C resistance: Secondary | ICD-10-CM | POA: Insufficient documentation

## 2015-05-09 ENCOUNTER — Telehealth: Payer: Self-pay | Admitting: Pulmonary Disease

## 2015-05-09 NOTE — Telephone Encounter (Addendum)
Pt got a bill from [employer] about a [facility] lab result called F5 gene test. Pt needs BQ to write a letter to send to [employer] that states that the labs that was drawn was not for research, but for treating his Pulmonary Embolism. He already paid the bill, but will need refunded for this. Labs were drawn on 5/25. The Claim ID is [id number].   You may call patient at [phone]. If you have any questions for me, I will be happy to help if possible.

## 2015-05-09 NOTE — Telephone Encounter (Signed)
Spoke with the pt  He states that the lab work that he had done on 03/08/15 was not covered by his insurance b/c it was determined as "experimental" The lab in question was the F-5 gene  He is requesting a letter from [place] stating that this was not experimental  Please advise thanks!

## 2015-05-11 ENCOUNTER — Encounter: Payer: Self-pay | Admitting: Emergency Medicine

## 2015-05-11 ENCOUNTER — Encounter: Payer: Self-pay | Admitting: Internal Medicine

## 2015-05-11 NOTE — Telephone Encounter (Signed)
Yes, we need to write a letter  To Whom It May Concern:  Jonathan Rios had an unprovoked large pulmonary embolism and DVT in 2016. He came to my clinic for further evaluation in May 2016. At that time because of the unprovoked nature of the pulmonary embolism we tested him for well-known and well described conditions which can predispose an individual to thrombophilia. Apparently the cost for the lab test for factor V Leiden was not covered. It should be noted that this was not an experimental or research type lab was one quite relevant to his clinical care. I am writing this letter in appeal so that he may be refunded the cost of the lab test.

## 2015-05-11 NOTE — Telephone Encounter (Signed)
Called and spoke to pt. Informed pt of the letter. Pt verbalized understanding and stated he would call back tomorrow on 7.29.16 to give the mailing address or fax number to [facility]. Letter has been given to [place] for safe keeping. Will await call from pt.

## 2015-05-12 NOTE — Telephone Encounter (Signed)
Patient called back with info needed.  Fax # for [facility] is [phone].  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

## 2015-05-12 NOTE — Telephone Encounter (Signed)
Letter has been sent.  Pt aware.  Nothing further needed at this time.

## 2015-07-17 ENCOUNTER — Other Ambulatory Visit: Payer: Self-pay

## 2015-07-17 ENCOUNTER — Telehealth: Payer: Self-pay | Admitting: Pulmonary Disease

## 2015-07-17 ENCOUNTER — Ambulatory Visit (HOSPITAL_COMMUNITY): Payer: Managed Care, Other (non HMO) | Attending: Pulmonary Disease

## 2015-07-17 DIAGNOSIS — R079 Chest pain, unspecified: Secondary | ICD-10-CM | POA: Diagnosis not present

## 2015-07-17 DIAGNOSIS — I071 Rheumatic tricuspid insufficiency: Secondary | ICD-10-CM | POA: Insufficient documentation

## 2015-07-17 DIAGNOSIS — I2782 Chronic pulmonary embolism: Secondary | ICD-10-CM

## 2015-07-17 DIAGNOSIS — I517 Cardiomegaly: Secondary | ICD-10-CM | POA: Insufficient documentation

## 2015-07-17 DIAGNOSIS — I2699 Other pulmonary embolism without acute cor pulmonale: Secondary | ICD-10-CM | POA: Insufficient documentation

## 2015-07-17 NOTE — Telephone Encounter (Signed)
I talked to [place] at [place] the issue is  Not the diagnosis it's the test the pt was upset and

## 2015-07-17 NOTE — Telephone Encounter (Signed)
Jonathan Rios - what do you need [place] to do with this?  Please advise?

## 2015-07-18 NOTE — Telephone Encounter (Signed)
Ok first off i received a fax that was fowarded to me from [place] [facility]     I walked down and spoke to the Lab and then fowarded the message she stated that see   So I can't view these labs in charges due to it being solstic so please call [employer] about the charge for the test not diagnosis code ( diagnosis was not the issue) to see what the info from the physician can be sent for possible appeal overturn decesion see insurance card in epic for phone number

## 2015-07-18 NOTE — Telephone Encounter (Signed)
Pt is wanted to be contacted regarding calling his insurance to get the appeal approved for the F5 gene testing.  Pt also faxed over a medical necessity criteria form as well (placed in BQ look at) [facility] [id number]. ID # [id number] Spoke with Waunita Schooner in claims. He reports the claim was denied by the medical review board. Stating the letter was not sufficient enough to overturn the decision of the denial. Per Waunita Schooner, the next step would to do a "provider appeal". A letter needs to be resubmitted being as detailed as possible and including pt history in this as well. Also per Waunita Schooner, once Dr. Lake Bells reviews the medical necessity criteria, if he feels any of this is appropriate for pt then he would also need to cite this in the letter as well.  The fax for this is [phone] ATTN: Provider Resolution Team. This can take up to 48 hrs to go through the system once this is faxed.   Please advise Dr. Lake Bells thanks

## 2015-07-24 ENCOUNTER — Encounter: Payer: Self-pay | Admitting: Pulmonary Disease

## 2015-07-24 NOTE — Telephone Encounter (Signed)
Okay, we need to write a letter stating the following  To Whom It May Concern: Mr. Pechacek was diagnosed with a submassive pulmonary malaise in which was life-threatening in 2016. This was an unprovoked event and as there is no clear underlying cause we are looking for genetic causes for this condition so that his family can be appropriately counseled if they are at risk for a hypercoagulable condition. Further, this would help [place] in risk stratification for him as we make decisions regarding the duration of anticoagulation in the future. I feel that a factor V Leiden test is very appropriate in this situation in am appealing that you pay for this test. Thanks, Roselie Awkward

## 2015-07-24 NOTE — Telephone Encounter (Signed)
Letter created for pt and faxed to the number given  Pt called and notified that appeal letter has been faxed to appeal office and can take 48-72 hours to process  Nothing further is needed

## 2015-07-25 ENCOUNTER — Telehealth: Payer: Self-pay | Admitting: Pulmonary Disease

## 2015-07-25 ENCOUNTER — Ambulatory Visit (INDEPENDENT_AMBULATORY_CARE_PROVIDER_SITE_OTHER): Payer: Managed Care, Other (non HMO) | Admitting: Pulmonary Disease

## 2015-07-25 ENCOUNTER — Encounter: Payer: Self-pay | Admitting: Pulmonary Disease

## 2015-07-25 VITALS — BP 138/82 | HR 103 | Ht 74.0 in | Wt 226.0 lb

## 2015-07-25 DIAGNOSIS — I2699 Other pulmonary embolism without acute cor pulmonale: Secondary | ICD-10-CM | POA: Diagnosis not present

## 2015-07-25 DIAGNOSIS — R05 Cough: Secondary | ICD-10-CM | POA: Diagnosis not present

## 2015-07-25 DIAGNOSIS — R059 Cough, unspecified: Secondary | ICD-10-CM | POA: Insufficient documentation

## 2015-07-25 MED ORDER — LOSARTAN POTASSIUM 100 MG PO TABS: 100.0000 mg | ORAL_TABLET | Freq: Every day | ORAL | Status: DC

## 2015-07-25 NOTE — Telephone Encounter (Signed)
Patient calling to get 90 day rx sent in instead of 30 day of the Losartan.  Sent 90 day supply, called pharmacy and backed out the 30 days supply Patient notified. Nothing further needed.

## 2015-07-25 NOTE — Patient Instructions (Signed)
Stop lisinopril Start taking losartan daily Continue taking Xarelto We will see you back in one year or sooner if needed

## 2015-07-25 NOTE — Assessment & Plan Note (Signed)
Fortunately his echocardiogram performed this month showed normalization of his right ventricle. He does not have evidence of ongoing pulmonary hypertension after his large pulmonary embolism. He is doing well on Xarelto. Today again we reviewed the fact there is no antidote for this medication but the overall risk of bleeding on this medication is lower than warfarin. He is comfortable continuing on the medication indefinitely. He does need to take this medication indefinitely because he had an idiopathic pulmonary embolism. He is heterozygous for factor V Leiden mutation, is not certain me how the factors in here but certainly does not make me want to stop his blood thinner medication anytime soon.  Plan: Continue Xarelto indefinitely

## 2015-07-25 NOTE — Progress Notes (Signed)
Subjective:    Patient ID: Jonathan Rios, male    DOB: 1956-04-24, 59 y.o.   MRN: [id number]  Synopsis: This is a 59 year old male who came to the pulmonary clinic in May 2016 after hospitalization for an unprovoked pulmonary embolism in April 2016. His echocardiogram during the hospitalization showed mild RV dilatation as well as large bilateral pulmonary emboli and normal pulmonary parenchymal fields.  He had bilateral DVT at that time.  His Pulmonary embolism was treated with EKOS catheter directed thrombolysis.  HPI Chief Complaint  Patient presents with  . Follow-up    pt c/o tickle in throat X6 mos, causes throat clearing, pnd, cough.  pt also notes increased fatigue, sob with less exertion than before.    Nettie has been doing well since last visit. He does not have shortness of breath. He denies leg pain, leg swelling, or chest pain. He has not had any problems bleeding of any kind. He does note a tickle in his throat which has been persistent for the last several months. This is associated with an occasional dry cough and occasional coughing spell. He does not produce mucus. He has heartburn from time to time for which he takes pantoprazole. He does have a postnasal drip.  Past Medical History  Diagnosis Date  . Hypertension   . Depression   . Family history of adverse reaction to anesthesia     "Mom gets PONV; oldest daughter is not as sensitive to it as dosage would suggest;she  takes more than want they would expect"  . Non Q wave myocardial infarction ([place]) 02/01/2015  . Sleep apnea     "not dx'd; family says I have it" (02/01/2015)  . GERD (gastroesophageal reflux disease)     hx  . Arthritis     "probably a little; finger joints" (02/01/2015)  . Dislocated cervical vertebra   . Tinnitus   . BPH (benign prostatic hypertrophy)     "treated for it and it got somewhat better"      Review of Systems  Constitutional: Negative for fever and unexpected weight change.  HENT:  Negative for congestion, dental problem, ear pain, mouth sores, nosebleeds, postnasal drip, rhinorrhea, sinus pressure, sneezing and trouble swallowing.   Eyes: Negative for redness and itching.  Respiratory: Negative for cough, chest tightness, shortness of breath and wheezing.   Cardiovascular: Negative for palpitations and leg swelling.  Gastrointestinal: Negative for nausea and vomiting.  Genitourinary: Negative for dysuria.  Musculoskeletal: Negative for back pain.  Skin: Negative for rash.  Neurological: Negative for headaches.  Hematological: Does not bruise/bleed easily.  Psychiatric/Behavioral: Negative for dysphoric mood. The patient is not nervous/anxious.        Objective:   Physical Exam  Filed Vitals:   07/25/15 1400  BP: 138/82  Pulse: 103  Height: 6\' 2"  (1.88 m)  Weight: 226 lb (102.513 kg)  SpO2: 98%  RA  Gen: well appearing HENT: OP clear, TM's clear, neck supple PULM: CTA B, normal percussion CV: RRR, no mgr, trace edema GI: BS+, soft, nontender Derm: no cyanosis or rash Psyche: normal mood and affect  07/2015 Echo reviewed, normal RV       Assessment & Plan:  Acute massive pulmonary embolism ([place]) Fortunately his echocardiogram performed this month showed normalization of his right ventricle. He does not have evidence of ongoing pulmonary hypertension after his large pulmonary embolism. He is doing well on Xarelto. Today again we reviewed the fact there is no antidote for this medication but  the overall risk of bleeding on this medication is lower than warfarin. He is comfortable continuing on the medication indefinitely. He does need to take this medication indefinitely because he had an idiopathic pulmonary embolism. He is heterozygous for factor V Leiden mutation, is not certain me how the factors in here but certainly does not make me want to stop his blood thinner medication anytime soon.  Plan: Continue Xarelto indefinitely  Cough This is a new  problem for him. He does not have a history of underlying lung disease aside from the pulmonary and blows him. His CT chest performed earlier this year did not show evidence of significant underlying lung disease.  Plan: Stop lisinopril Start losartan He will let me know if this is not effective.     Current outpatient prescriptions:  .  buPROPion (WELLBUTRIN) 100 MG tablet, Take 100 mg by mouth 3 (three) times daily. , Disp: , Rfl:  .  ibuprofen (ADVIL,MOTRIN) 200 MG tablet, Take 600 mg by mouth every 6 (six) hours as needed (for pain)., Disp: , Rfl:  .  NONFORMULARY OR COMPOUNDED ITEM, Graduated Compression Hose Knee High  30-40 mm Hg compression, Disp: 1 each, Rfl: PRN .  pantoprazole (PROTONIX) 40 MG tablet, Take 1 tablet (40 mg total) by mouth daily at 12 noon., Disp: 30 tablet, Rfl: 6 .  rivaroxaban (XARELTO) 20 MG TABS tablet, Take 1 tablet (20 mg total) by mouth daily. Start this after completing the 15 mg twice a day, Disp: 30 tablet, Rfl: 6 .  losartan (COZAAR) 100 MG tablet, Take 1 tablet (100 mg total) by mouth daily., Disp: 30 tablet, Rfl: 11

## 2015-07-25 NOTE — Assessment & Plan Note (Signed)
This is a new problem for him. He does not have a history of underlying lung disease aside from the pulmonary and blows him. His CT chest performed earlier this year did not show evidence of significant underlying lung disease.  Plan: Stop lisinopril Start losartan He will let me know if this is not effective.

## 2015-08-01 ENCOUNTER — Telehealth: Payer: Self-pay | Admitting: Pulmonary Disease

## 2015-08-01 ENCOUNTER — Encounter: Payer: Self-pay | Admitting: *Deleted

## 2015-08-01 NOTE — Telephone Encounter (Signed)
Dr Lake Bells, please advise on previous letter written.  It has a diagnosis of submassive pulmonary malaise.  See below:  To Whom It May Concern: Jonathan Rios was diagnosed with a submassive pulmonary malaise in which was life-threatening in 2016. This was an unprovoked event and as there is no clear underlying cause we are looking for genetic causes for this condition so that his family can be appropriately counseled if they are at risk for a hypercoagulable condition. Further, this would help [place] in risk stratification for him as we make decisions regarding the duration of anticoagulation in the future. I feel that a factor V Leiden test is very appropriate in this situation in am appealing that you pay for this test.

## 2015-08-03 NOTE — Telephone Encounter (Signed)
Diagnosis: Submassive pulmonary embolism

## 2015-08-04 ENCOUNTER — Encounter: Payer: Self-pay | Admitting: *Deleted

## 2015-08-04 NOTE — Telephone Encounter (Signed)
Letter has been updated with proper information and faxed to [facility].  Pt notified. Copy of letter mailed to pt per pt's request. Nothing further needed.

## 2015-09-19 ENCOUNTER — Telehealth: Payer: Self-pay | Admitting: Pulmonary Disease

## 2015-09-19 MED ORDER — PANTOPRAZOLE SODIUM 40 MG PO TBEC: 40.0000 mg | DELAYED_RELEASE_TABLET | Freq: Every day | ORAL | Status: DC

## 2015-09-19 MED ORDER — RIVAROXABAN 20 MG PO TABS: 20.0000 mg | ORAL_TABLET | Freq: Every day | ORAL | Status: DC

## 2015-09-19 NOTE — Telephone Encounter (Signed)
Spoke with pt. States he needs refills on Xarelto and Pantoprazole for 90 day supplies. Both rx have been sent in. Nothing further was needed at this time.

## 2015-12-29 ENCOUNTER — Encounter: Payer: Self-pay | Admitting: Internal Medicine

## 2016-03-17 ENCOUNTER — Other Ambulatory Visit: Payer: Self-pay | Admitting: Pulmonary Disease

## 2016-04-11 ENCOUNTER — Other Ambulatory Visit: Payer: Self-pay | Admitting: Pulmonary Disease

## 2016-04-22 ENCOUNTER — Telehealth: Payer: Self-pay | Admitting: Pulmonary Disease

## 2016-04-22 MED ORDER — PANTOPRAZOLE SODIUM 40 MG PO TBEC: 40.0000 mg | DELAYED_RELEASE_TABLET | Freq: Every day | ORAL | Status: DC

## 2016-04-22 NOTE — Telephone Encounter (Signed)
Last ov with BQ on 07/25/15 Patient Instructions       Stop lisinopril Start taking losartan daily Continue taking Xarelto We will see you back in one year or sooner if needed   Called spoke ith pt. Pt is requesting a refill on his pantoprazole. I verified pharmacy as [facility] in [facility]. He voiced understanding and had no further questions. RX sent. Nothing further needed.

## 2016-06-25 ENCOUNTER — Other Ambulatory Visit: Payer: Self-pay | Admitting: Pulmonary Disease

## 2016-07-24 ENCOUNTER — Ambulatory Visit (INDEPENDENT_AMBULATORY_CARE_PROVIDER_SITE_OTHER): Payer: Managed Care, Other (non HMO) | Admitting: Pulmonary Disease

## 2016-07-24 ENCOUNTER — Encounter: Payer: Self-pay | Admitting: Pulmonary Disease

## 2016-07-24 DIAGNOSIS — I2699 Other pulmonary embolism without acute cor pulmonale: Secondary | ICD-10-CM

## 2016-07-24 MED ORDER — RIVAROXABAN 20 MG PO TABS: ORAL_TABLET | ORAL | 3 refills | Status: DC

## 2016-07-24 MED ORDER — LOSARTAN POTASSIUM 100 MG PO TABS: 100.0000 mg | ORAL_TABLET | Freq: Every day | ORAL | 3 refills | Status: DC

## 2016-07-24 NOTE — Patient Instructions (Signed)
Taper off of pantoprazole as we discussed Keep taking losartan and Xarelto. I will see you back in one year or sooner if needed

## 2016-07-24 NOTE — Assessment & Plan Note (Signed)
We discussed the risks and benefits of ongoing Xarelto at length today. Because he had a massive idiopathic pulmonary embolism I explained to him that his risk of recurrence off of anticoagulant therapy is somewhere in the range of 5-10% or higher based on the available clinical evidence in signs of acute literature. Further, the risk of a major bleeding event based on other clinical trials is 1-3% on Xarelto.  At this time he chooses to continue Xarelto. We'll plan on using this indefinitely per guideline recommendations for patients with idiopathic pulmonary embolism.

## 2016-07-24 NOTE — Progress Notes (Signed)
Subjective:    Patient ID: Jonathan Rios, male    DOB: [birth year], 60 y.o.   MRN: [id number]  Synopsis: This is a 60 year old male who came to the pulmonary clinic in May 2016 after hospitalization for an unprovoked pulmonary embolism in April 2016. His echocardiogram during the hospitalization showed mild RV dilatation as well as large bilateral pulmonary emboli and normal pulmonary parenchymal fields.  He had bilateral DVT at that time.  His Pulmonary embolism was treated with EKOS catheter directed thrombolysis.  HPI Chief Complaint  Patient presents with  . Follow-up    pt doing well, no complaints today.     Jonathan Rios has been doing well since the last visit.  He has no dyspnea.  He still takes Xarelto daily.  No bleeding.    He would like to stop taking the Xarelto and all medicines in general.  He would like to try to save money by consolidating his medicines.    No cough right now.    Past Medical History:  Diagnosis Date  . Arthritis    "probably a little; finger joints" (02/01/2015)  . BPH (benign prostatic hypertrophy)    "treated for it and it got somewhat better"  . Depression   . Dislocated cervical vertebra   . Family history of adverse reaction to anesthesia    "Mom gets PONV; oldest daughter is not as sensitive to it as dosage would suggest;she  takes more than want they would expect"  . GERD (gastroesophageal reflux disease)    hx  . Hypertension   . 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 Sleep apnea    "not dx'd; family says I have it" (02/01/2015)  . Tinnitus       Review of Systems  Constitutional: Negative for fever and unexpected weight change.  HENT: Negative for congestion, dental problem, ear pain, mouth sores, nosebleeds, postnasal drip, rhinorrhea, sinus pressure, sneezing and trouble swallowing.   Eyes: Negative for redness and itching.  Respiratory: Negative for cough, chest tightness, shortness of breath and wheezing.     Cardiovascular: Negative for palpitations and leg swelling.  Gastrointestinal: Negative for nausea and vomiting.  Genitourinary: Negative for dysuria.  Musculoskeletal: Negative for back pain.  Skin: Negative for rash.  Neurological: Negative for headaches.  Hematological: Does not bruise/bleed easily.  Psychiatric/Behavioral: Negative for dysphoric mood. The patient is not nervous/anxious.        Objective:   Physical Exam  Vitals:   07/24/16 1056  BP: 140/66  Pulse: 85  SpO2: 98%  Weight: 241 lb 3.2 oz (109.4 kg)  Height: 6\' 2"  (1.88 m)  RA  Gen: well appearing HENT: OP clear, TM's clear, neck supple PULM: CTA B, normal percussion CV: RRR, no mgr, trace edema GI: BS+, soft, nontender Derm: no cyanosis or rash Psyche: normal mood and affect  07/2015 Echo reviewed, normal RV       Assessment & Plan:  Acute massive pulmonary embolism (HCC) We discussed the risks and benefits of ongoing Xarelto at length today. Because he had a massive idiopathic pulmonary embolism I explained to him that his risk of recurrence off of anticoagulant therapy is somewhere in the range of 5-10% or higher based on the available clinical evidence in signs of acute literature. Further, the risk of a major bleeding event based on other clinical trials is 1-3% on Xarelto.  At this time he chooses to continue Xarelto. We'll plan on using this indefinitely per guideline recommendations for patients with  idiopathic pulmonary embolism.  Cough Resolved off of lisinopril. Continue losartan. Stop pantoprazole as he has no acid reflux.  > 50% of today's 27 minute visit spent face to face in consultation  Current Outpatient Prescriptions:  .  losartan (COZAAR) 100 MG tablet, TAKE 1 TABLET (100 MG TOTAL) BY MOUTH DAILY., Disp: 90 tablet, Rfl: 0 .  NONFORMULARY OR COMPOUNDED ITEM, Graduated Compression Hose Knee High  30-40 mm Hg compression, Disp: 1 each, Rfl: PRN .  pantoprazole (PROTONIX) 40 MG  tablet, Take 1 tablet (40 mg total) by mouth daily at 12 noon., Disp: 90 tablet, Rfl: 1 .  XARELTO 20 MG TABS tablet, TAKE 1 TABLET (20 MG TOTAL) BY MOUTH DAILY., Disp: 90 tablet, Rfl: 1

## 2016-07-24 NOTE — Assessment & Plan Note (Signed)
Resolved off of lisinopril. Continue losartan. Stop pantoprazole as he has no acid reflux.

## 2016-10-06 IMAGING — CR DG CHEST 1V PORT
2 series · 2 of 2 positions shown · non-contrast
Comparison: None.

CLINICAL DATA: Shortness of breath and lightheadedness

EXAM:
PORTABLE CHEST - 1 VIEW

[AP (1 of 2)]
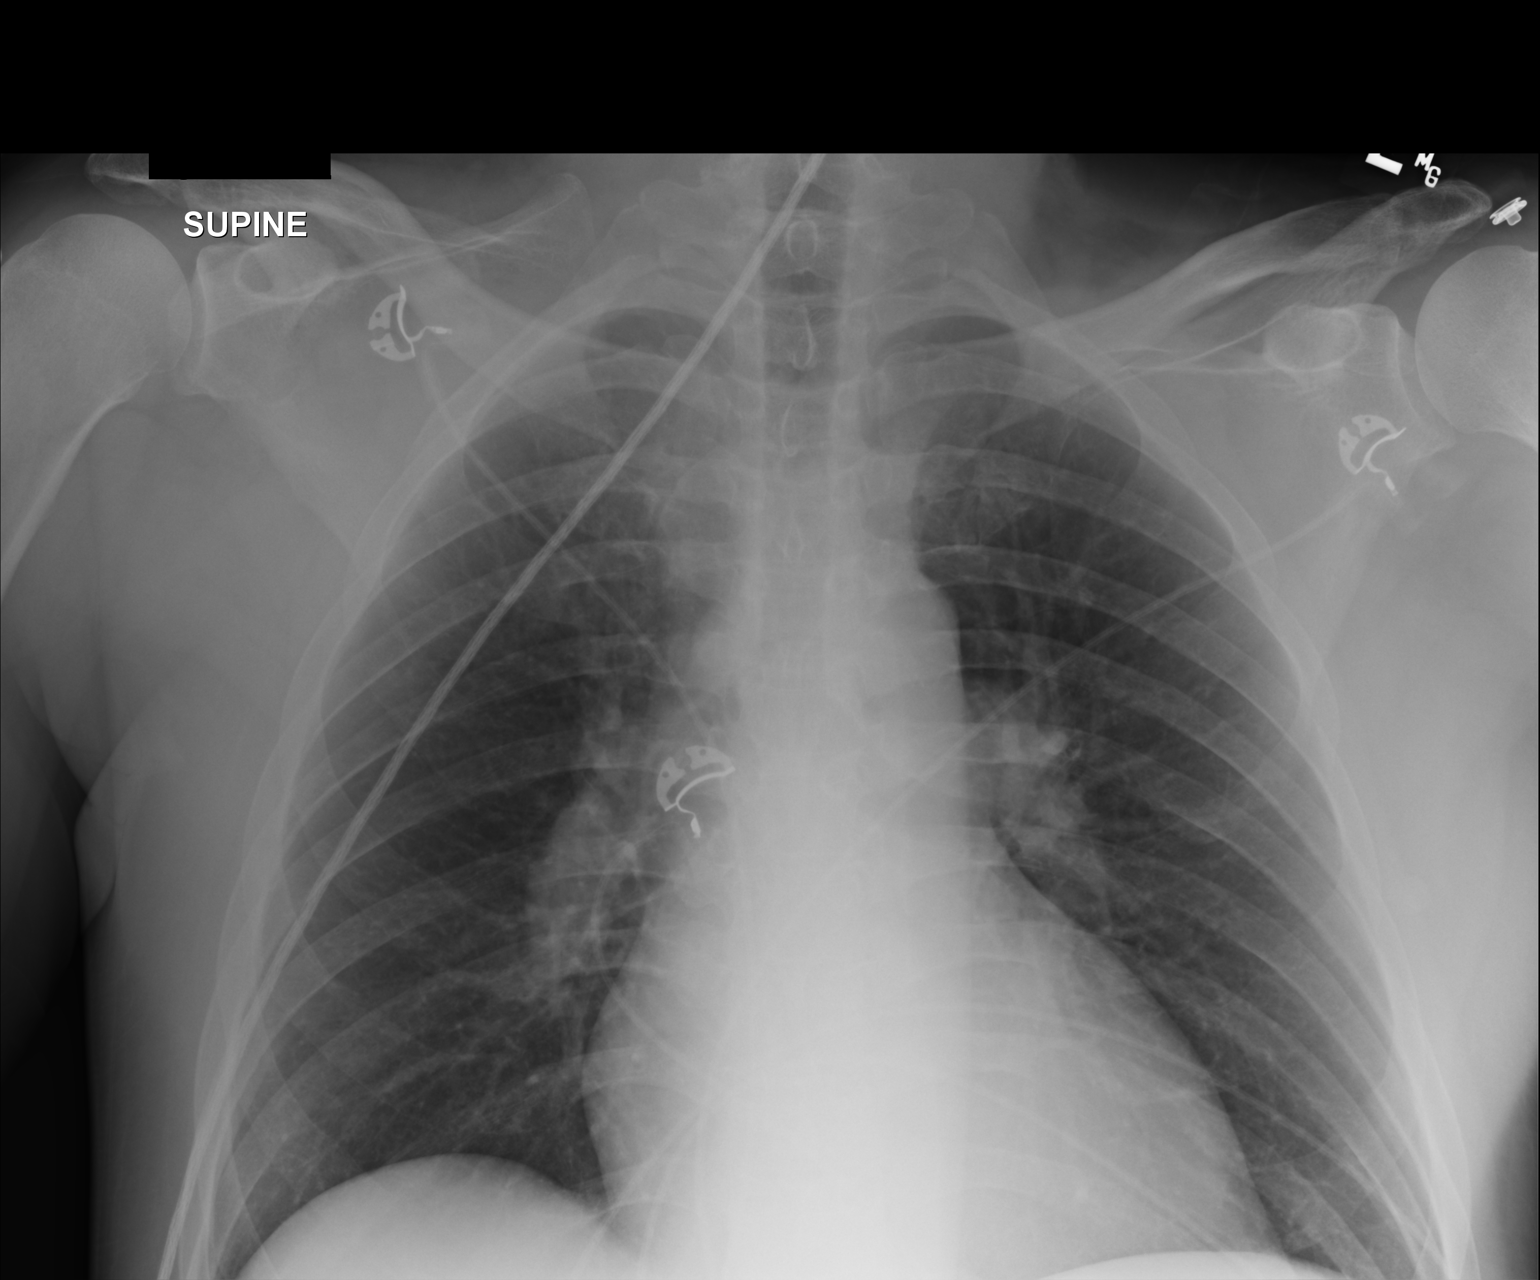

[AP (2 of 2)]
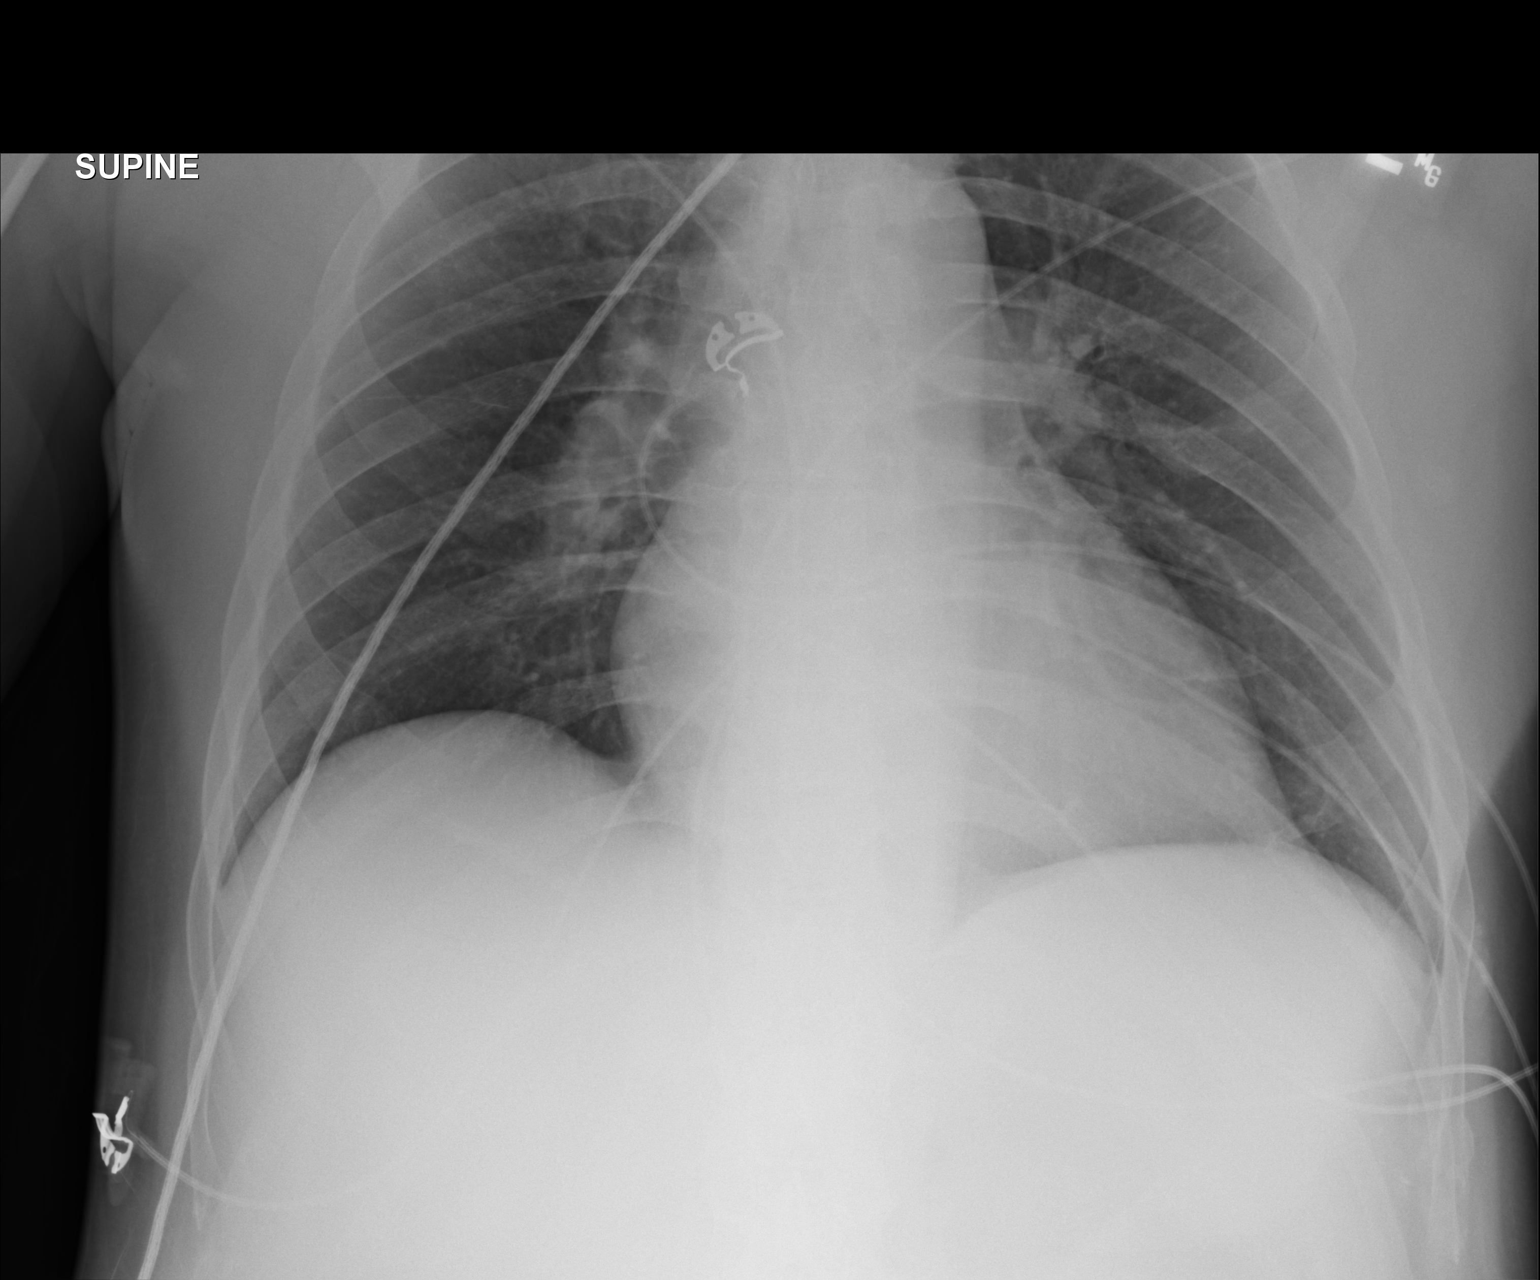

[2 of 2 positions shown; findings below may reference images not displayed]

FINDINGS: Normal heart size and mediastinal contours. No acute infiltrate or
edema. No effusion or pneumothorax. No acute osseous findings.
IMPRESSION: Negative portable chest.

## 2016-10-06 IMAGING — CR DG CHEST 1V PORT
1 series · 2 of 2 positions shown · non-contrast
Comparison: None.

CLINICAL DATA: Chest pain and short of breath

EXAM:
PORTABLE CHEST - 1 VIEW

[Series 1: AP · U · 2 of 2 slices shown]
[im 1/2]
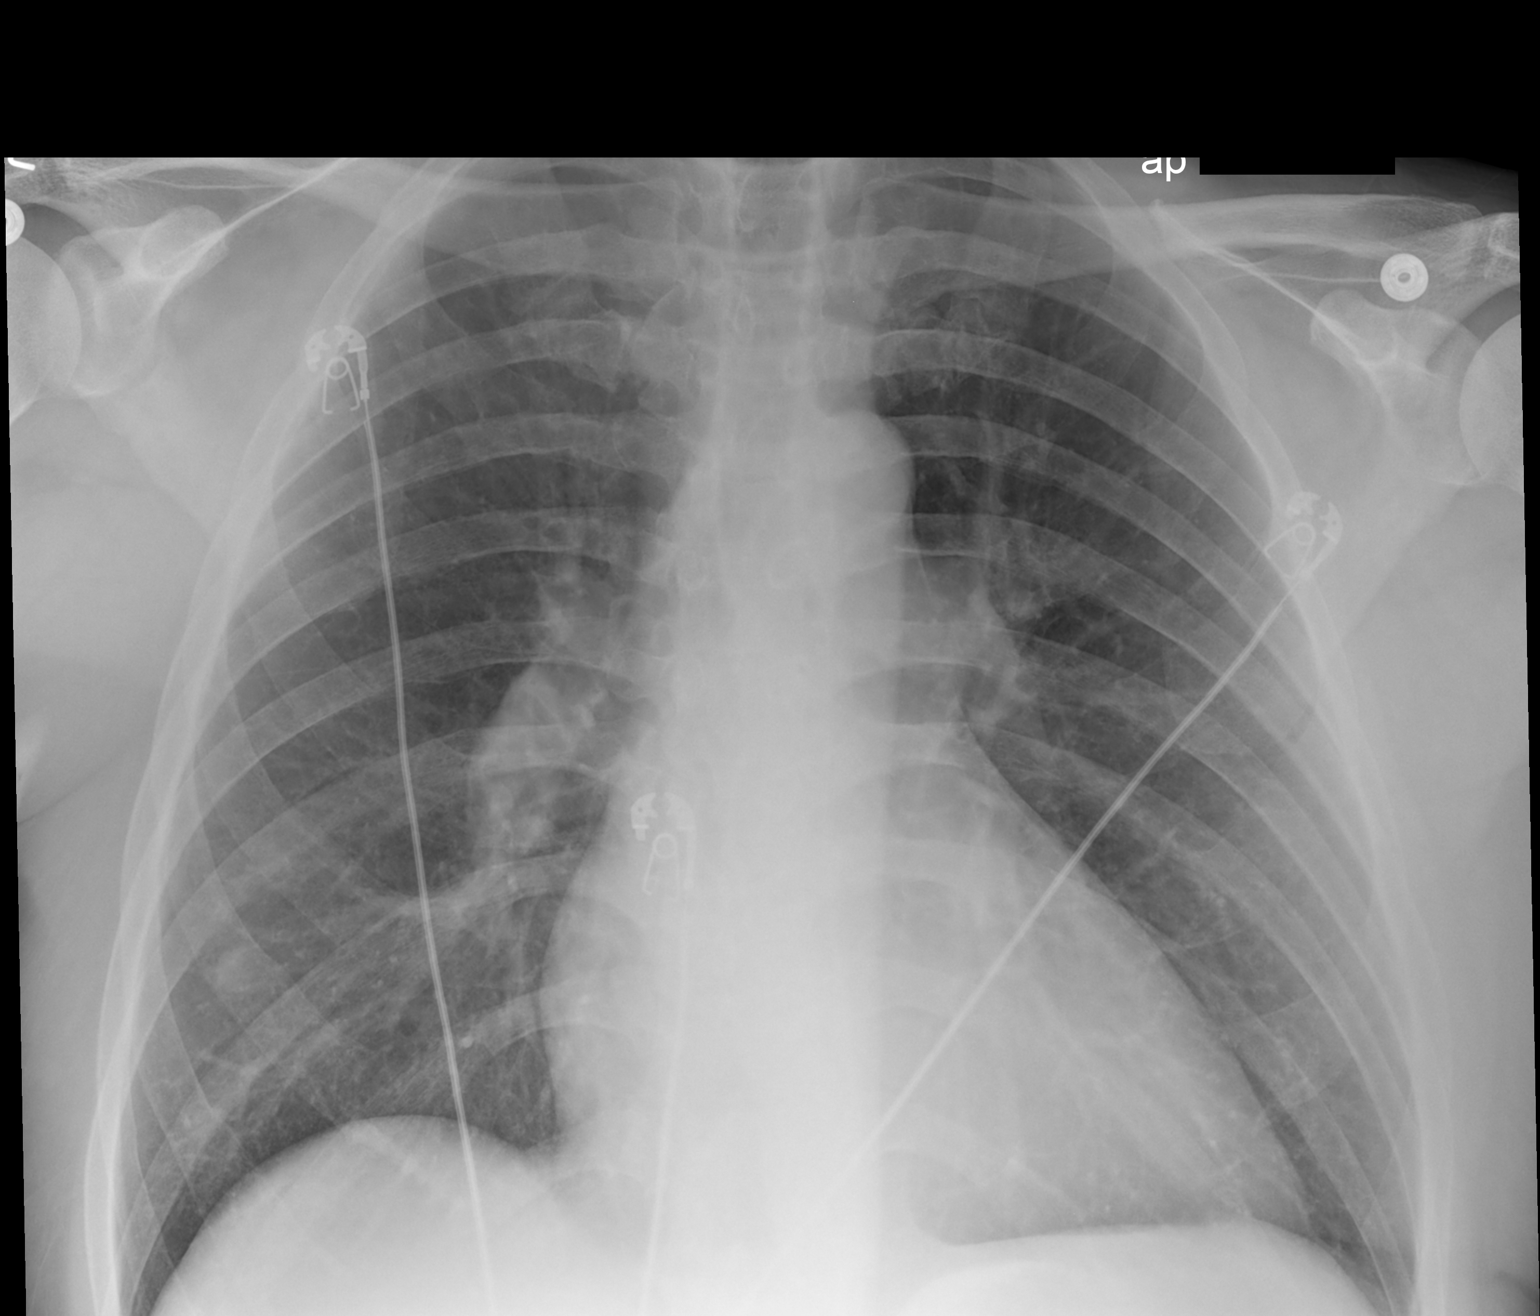
[im 2/2]
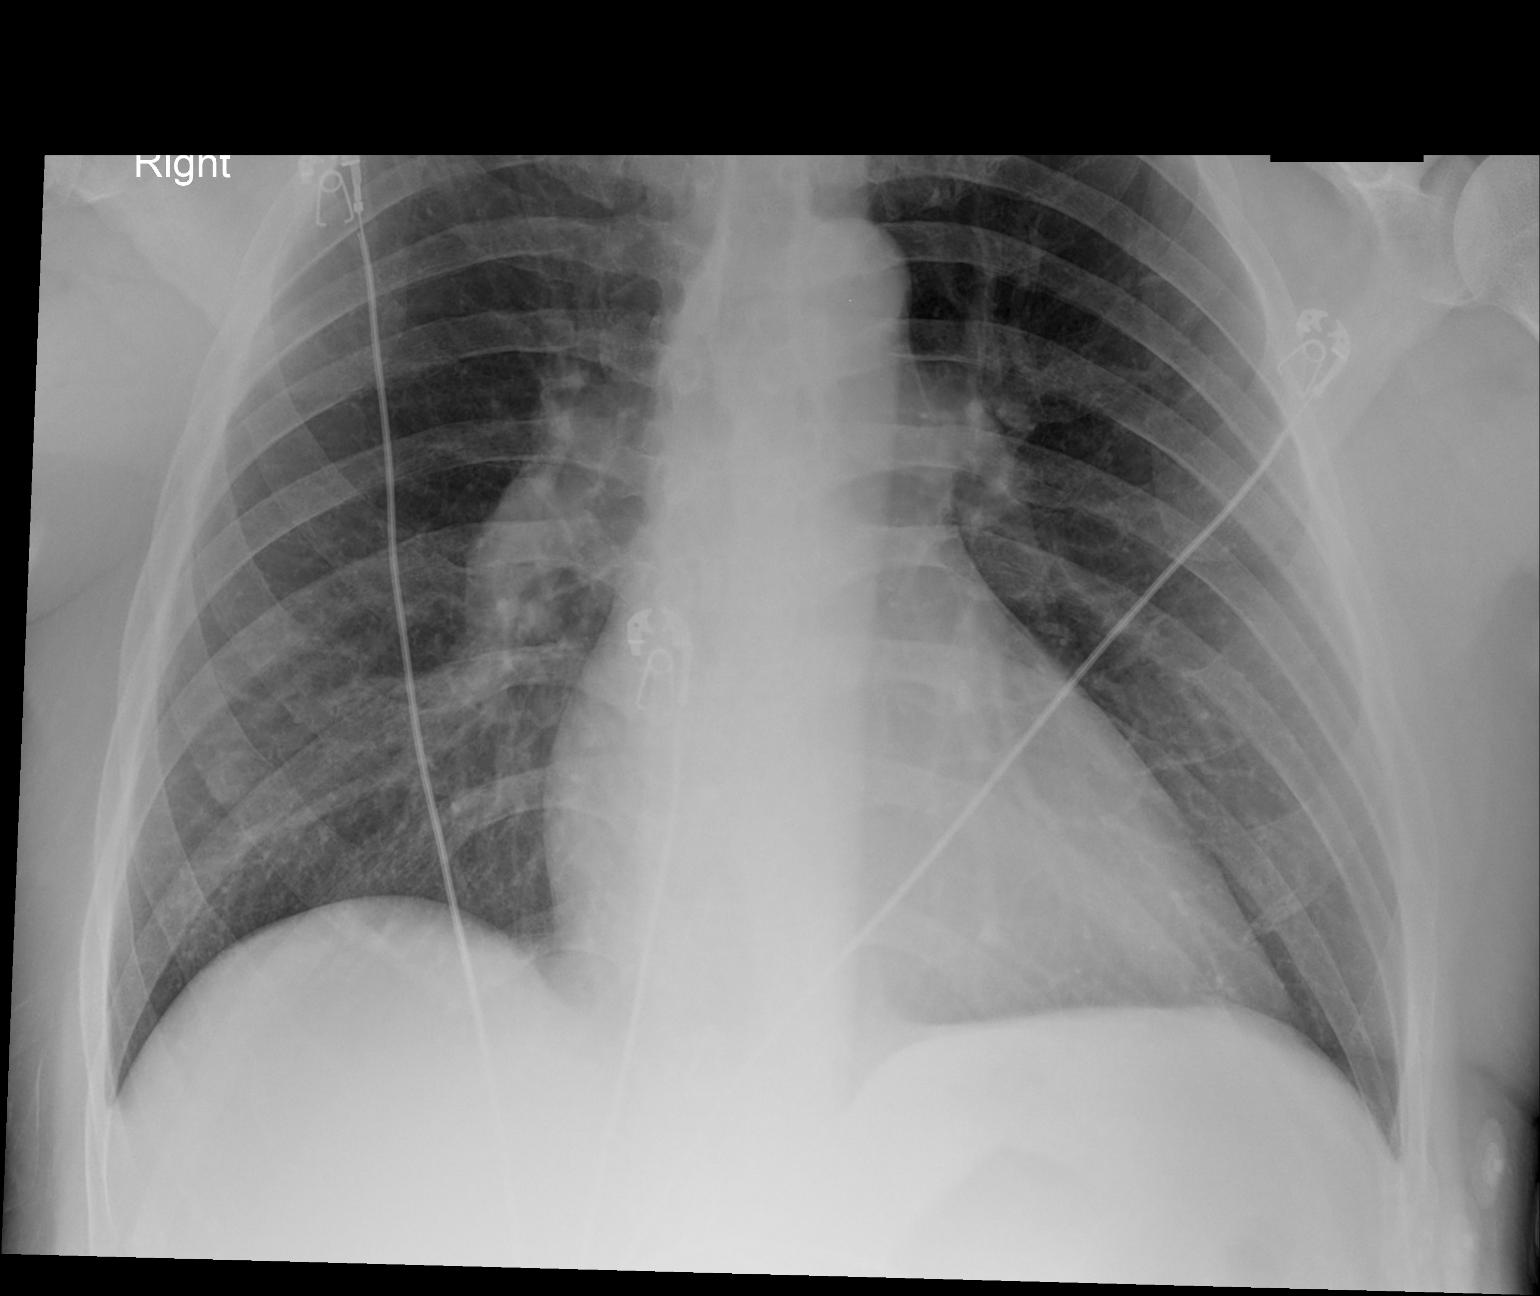

[2 of 2 positions shown; findings below may reference images not displayed]

FINDINGS: The heart size and mediastinal contours are within normal limits.
Both lungs are clear. The visualized skeletal structures are
unremarkable.
IMPRESSION: No active disease.

## 2016-10-08 IMAGING — XA IR THROMB F/U EVAL ART/VEN FINAL DAY
1 series · 2 of 2 positions shown · non-contrast
Comparison: none

CLINICAL DATA: Bilateral pulmonary emboli and follow-up
ultrasound-assistedcatheter directed thrombolysis.

[Series 1: run · 2 of 2 slices shown]
[im 1/2]
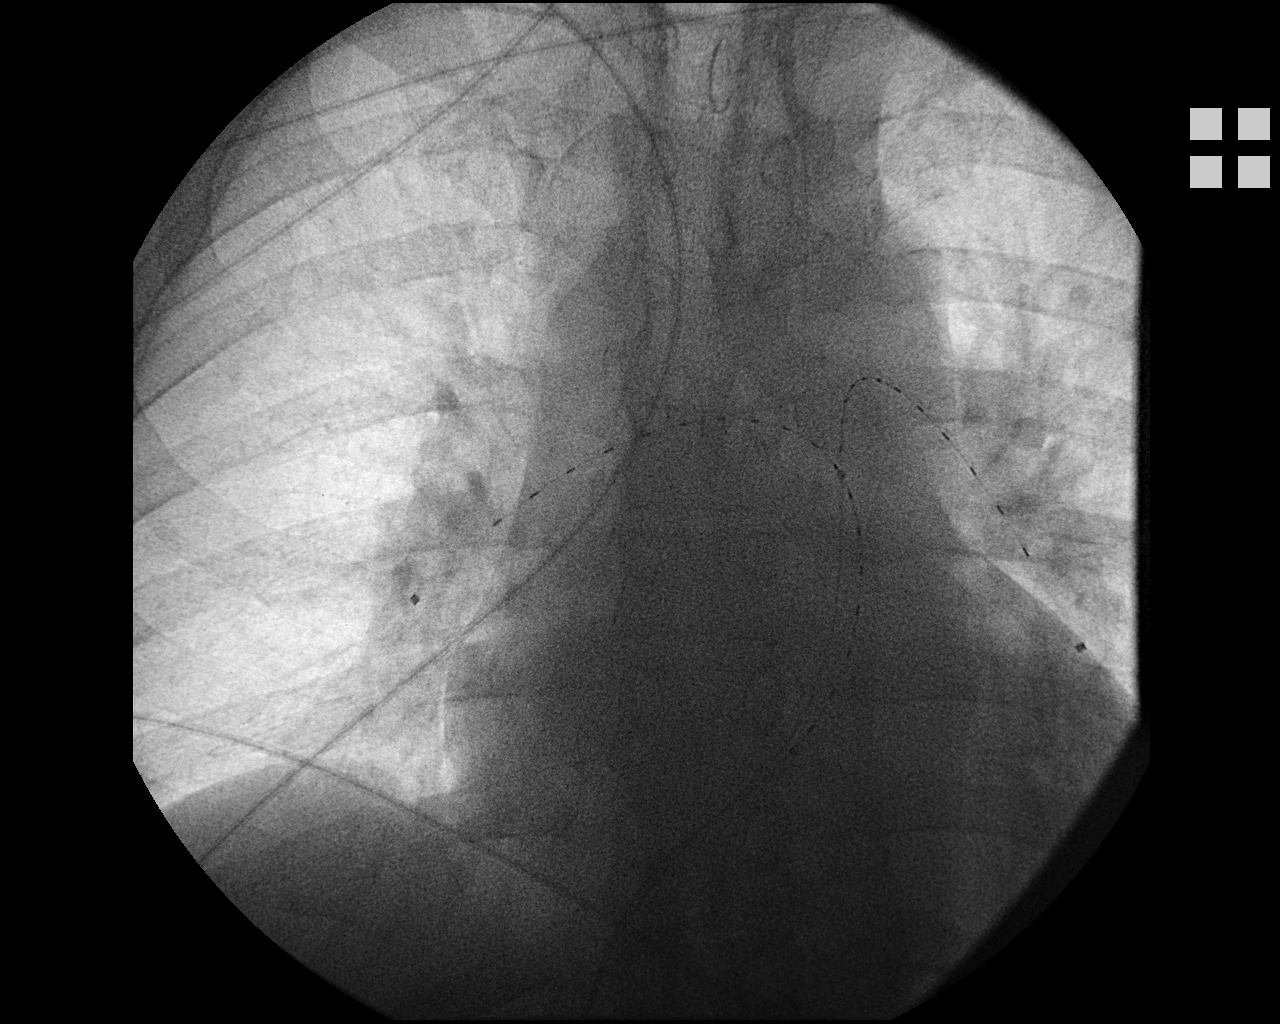
[im 2/2]
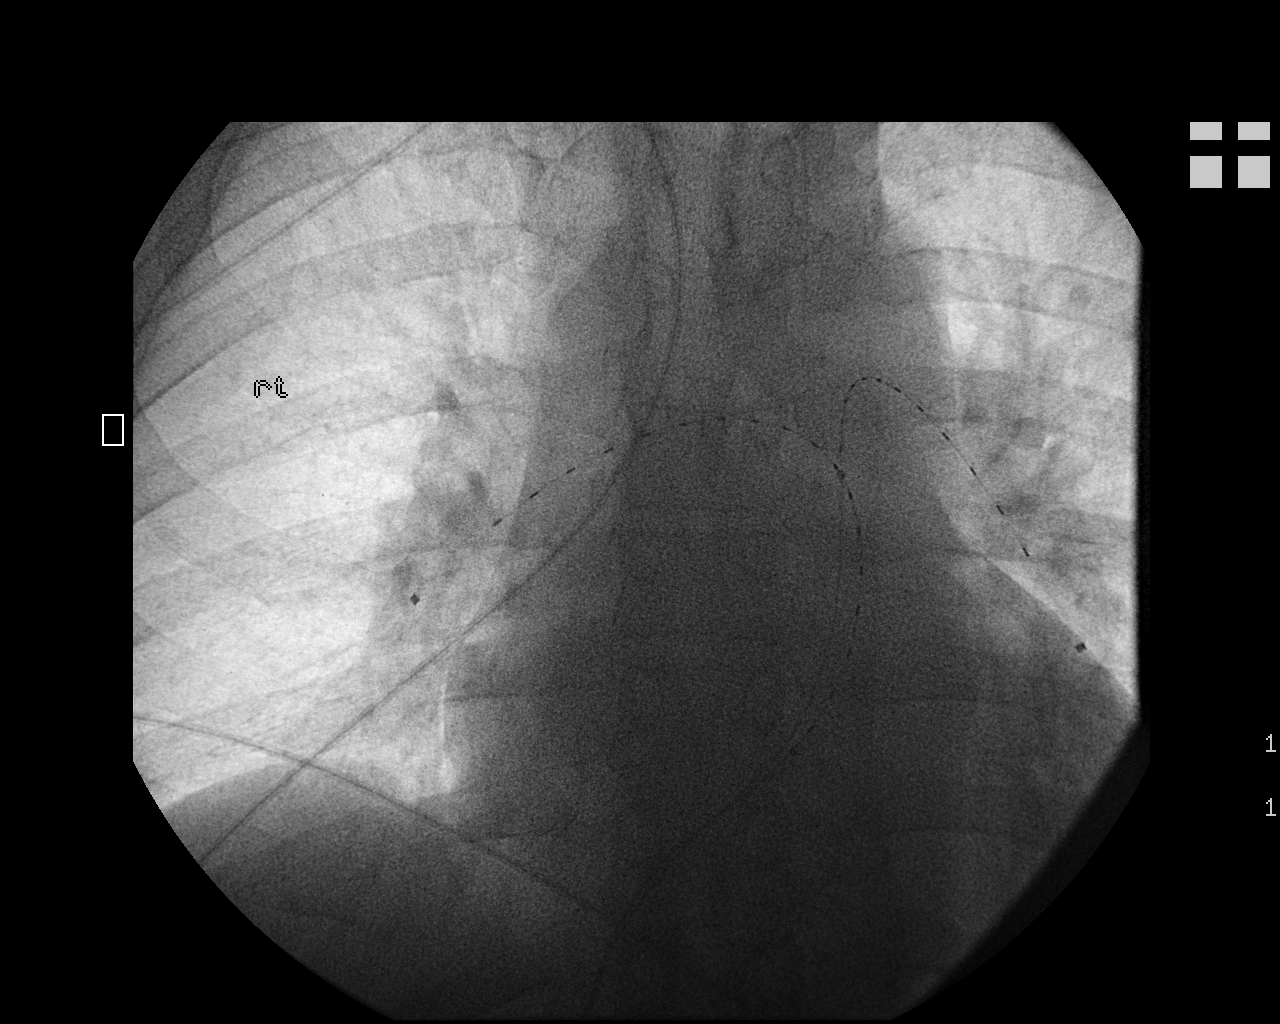

[2 of 2 positions shown; findings below may reference images not displayed]

EXAM:
FOLLOW-UP THROMBOLYTIC THERAPY WITH PRESSURE MEASUREMENTS.

FLUOROSCOPY TIME:  24 seconds, 7.6 mGy

MEDICATIONS:
None

ANESTHESIA/SEDATION:
Moderate sedation time: None

PROCEDURE:
Patient was placed supine on the interventional table. Position of
the catheters was identified. The left pulmonary artery catheter was
attached to the pressure transducer. Pressures were obtained in the
left pulmonary artery. Catheter was pulled back under fluoroscopy.
The entire catheter was removed. Similarly, pressures were obtained
from the right pulmonary artery catheter as it was pulled into the
main pulmonary artery. The entire right pulmonary catheter was
removed. Both vascular sheaths were removed with manual compression.
Bandages were placed at the puncture sites.
FINDINGS: Bilateral pulmonary artery catheters were unchanged in position.
Pressure in the left pulmonary artery was 51/22 mmHg, mean 34.
Pressure in the right pulmonary artery was 52/27 mmHg, mean 38. Main
pulmonary artery pressure was 47/33 mmHg, mean 38.

COMPLICATIONS:
None
IMPRESSION: Completion of catheter-directed ultrasound-assisted thrombolytic
therapy in the pulmonary arteries. Pulmonary artery pressures remain
elevated.

## 2016-12-25 ENCOUNTER — Telehealth: Payer: Self-pay | Admitting: Pulmonary Disease

## 2016-12-25 NOTE — Telephone Encounter (Signed)
lmtcb x1 for pt. 

## 2016-12-27 NOTE — Telephone Encounter (Signed)
Called and spoke with pt and he stated that he has 2 questions for BQ:  1.  He would like to stop the xarelto and start with an aspirin daily.  He stated that he would incorporate the green leafy vegetable and others to help with this.  2.  He stated that he started on a tumeric mixture and he stated that since doing this his BP has gone from reading around 150/90 to 102/70.  He is going to continue with this mixture and would like to get away from taking the losartan.    Pt is requesting recs of these 2 questions from BQ.  Please advise. thanks

## 2016-12-29 NOTE — Telephone Encounter (Signed)
I'm uncomfortable stopping the Xarelto altogether because his risk of a recurrent DVT/PE is high.  He and I discussed this on the last visit at length.  If he wants to stop Xarelto and take ASA he will have to do so against my recommendation.  We could decrease the dose of Xarelto however.  Either way we should discuss face to face.  As far as the BP goes, he needs to f/u with his PCP regarding this.  I realize I changed from lisinopril from losartan but a judgement call about treating his BP long term is a discussion he needs to have with his PCP.

## 2016-12-30 NOTE — Telephone Encounter (Signed)
Spoke with pt. He is aware of BQ's recommendation. He declined against making an appointment with BQ at this time. Nothing further was needed.

## 2017-07-31 ENCOUNTER — Ambulatory Visit (INDEPENDENT_AMBULATORY_CARE_PROVIDER_SITE_OTHER): Payer: Commercial Managed Care - PPO | Admitting: Pulmonary Disease

## 2017-07-31 ENCOUNTER — Encounter: Payer: Self-pay | Admitting: Pulmonary Disease

## 2017-07-31 VITALS — BP 146/84 | HR 85 | Ht 74.0 in | Wt 252.8 lb

## 2017-07-31 DIAGNOSIS — I2602 Saddle embolus of pulmonary artery with acute cor pulmonale: Secondary | ICD-10-CM

## 2017-07-31 MED ORDER — RIVAROXABAN 10 MG PO TABS: 10.0000 mg | ORAL_TABLET | Freq: Every day | ORAL | 11 refills | Status: DC

## 2017-07-31 MED ORDER — RIVAROXABAN 10 MG PO TABS: 10.0000 mg | ORAL_TABLET | Freq: Every day | ORAL | 3 refills | Status: AC

## 2017-07-31 NOTE — Patient Instructions (Signed)
For your history of pulmonary embolism We are going to reduce the dose of Xarelto to 10mg  daily based on the EINSTEIN-CHOICE trial that showed continued benefit in reducing the rate of recurrence of DVT Please have your PCP forward me results of the kidney function lab from your annual visit  We will see you back in one year or you can follow-up with your primary care physician

## 2017-07-31 NOTE — Progress Notes (Signed)
Subjective:    Patient ID: Jonathan Rios, male    DOB: 06-01-1956, 61 y.o.   MRN: [id number]  Synopsis: This is a 61 year old male who came to the pulmonary clinic in May 2016 after hospitalization for an unprovoked pulmonary embolism in April 2016. His echocardiogram during the hospitalization showed mild RV dilatation as well as large bilateral pulmonary emboli and normal pulmonary parenchymal fields.  He had bilateral DVT at that time.  His Pulmonary embolism was treated with EKOS catheter directed thrombolysis.  HPI Chief Complaint  Patient presents with  . Follow-up    pt notes some sob with exertion- had a knee injury that kept him from exercising.     Jonathan Rios says that he recently had some trouble with low blood pressure which he attributes to taking blood pressure meds.  No breathing difficulty.  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  He doesn't thinks this is primarily a breathing problem, just no stamina.    He reports that about three months ago he had an ache in his R knee that started insidiously.  He decreased his activity level to help the pain go away but this made him weaker.    He says that he bleed more easily, but nothing severe.  If he gets a small cut he will continue to bleed for a few minutes before he stops.    Past Medical History:  Diagnosis Date  . Arthritis    "probably a little; finger joints" (02/01/2015)  . BPH (benign prostatic hypertrophy)    "treated for it and it got somewhat better"  . Depression   . Dislocated cervical vertebra   . Family history of adverse reaction to anesthesia    "Mom gets PONV; oldest daughter is not as sensitive to it as dosage would suggest;she  takes more than want they would expect"  . GERD (gastroesophageal reflux disease)    hx  . Hypertension   . 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 Sleep apnea    "not dx'd; family says I have it" (02/01/2015)  . Tinnitus       Review of  Systems  Constitutional: Negative for fever and unexpected weight change.  HENT: Negative for congestion, dental problem, ear pain, mouth sores, nosebleeds, postnasal drip, rhinorrhea, sinus pressure, sneezing and trouble swallowing.   Eyes: Negative for redness and itching.  Respiratory: Negative for cough, chest tightness, shortness of breath and wheezing.   Cardiovascular: Negative for palpitations and leg swelling.  Gastrointestinal: Negative for nausea and vomiting.  Genitourinary: Negative for dysuria.  Musculoskeletal: Negative for back pain.  Skin: Negative for rash.  Neurological: Negative for headaches.  Hematological: Does not bruise/bleed easily.  Psychiatric/Behavioral: Negative for dysphoric mood. The patient is not nervous/anxious.        Objective:   Physical Exam  Vitals:   07/31/17 1536  BP: (!) 146/84  Pulse: 85  SpO2: 99%  Weight: 252 lb 12.8 oz (114.7 kg)  Height: 6\' 2"  (1.88 m)  RA  Gen: well appearing HENT: OP clear, TM's clear, neck supple PULM: CTA B, normal percussion CV: RRR, no mgr, trace edema GI: BS+, soft, nontender Derm: no cyanosis or rash Psyche: normal mood and affect   07/2015 Echo reviewed, normal RV   CBC    Component Value Date/Time   WBC 9.1 02/06/2015 0500   RBC 4.66 02/06/2015 0500   HGB 13.9 02/06/2015 0500   HCT 41.1 02/06/2015 0500  PLT 282 02/06/2015 0500   MCV 88.2 02/06/2015 0500   MCH 29.8 02/06/2015 0500   MCHC 33.8 02/06/2015 0500   RDW 13.5 02/06/2015 0500   LYMPHSABS 1.4 02/06/2015 0500   MONOABS 0.9 02/06/2015 0500   EOSABS 0.4 02/06/2015 0500   BASOSABS 0.1 02/06/2015 0500   BMET    Component Value Date/Time   NA 135 02/06/2015 0500   K 3.8 02/06/2015 0500   CL 101 02/06/2015 0500   CO2 24 02/06/2015 0500   GLUCOSE 109 (H) 02/06/2015 0500   BUN 11 02/06/2015 0500   CREATININE 1.04 02/06/2015 0500   CALCIUM 8.3 (L) 02/06/2015 0500   GFRNONAA 77 (L) 02/06/2015 0500   GFRAA 90 (L) 02/06/2015 0500           Assessment & Plan:  Acute saddle pulmonary embolism with acute cor pulmonale (HCC)  Discussion: This has been a stable interval for Jonathan Rios. He has idiopathic pulmonary embolism which was massive requiring thromboliysis. He has been doing well since his hospitalization. He has no evidence of pulmonary hypertension based on his physical exam in the last echocardiogram was normal. We know from multiple clinical trials that reducing the dose of oral anticoagulants can still cause benefit in terms of preventing pulmonary embolism while reducing the rate of bleeding.  Plan: For your history of pulmonary embolism We are going to reduce the dose of Xarelto to 10mg  daily based on the EINSTEIN-CHOICE trial that showed continued benefit in reducing the rate of recurrence of DVT Please have your PCP forward me results of the kidney function lab from your annual visit  We will see you back in one year or you can follow-up with your primary care physician  > 15 minutes spent face to face, 26 minute visit   Current Outpatient Prescriptions:  .  losartan (COZAAR) 100 MG tablet, Take 1 tablet (100 mg total) by mouth daily., Disp: 90 tablet, Rfl: 3 .  NONFORMULARY OR COMPOUNDED ITEM, Graduated Compression Hose Knee High  30-40 mm Hg compression, Disp: 1 each, Rfl: PRN .  rivaroxaban (XARELTO) 20 MG TABS tablet, TAKE 1 TABLET (20 MG TOTAL) BY MOUTH DAILY., Disp: 90 tablet, Rfl: 3

## 2017-07-31 NOTE — Addendum Note (Signed)
Addended by: Len Blalock on: 07/31/2017 04:12 PM   Modules accepted: Orders

## 2017-09-25 ENCOUNTER — Other Ambulatory Visit: Payer: Self-pay | Admitting: Pulmonary Disease

## 2018-01-12 ENCOUNTER — Telehealth: Payer: Self-pay

## 2018-01-12 ENCOUNTER — Encounter: Payer: Self-pay | Admitting: Gastroenterology

## 2018-01-12 NOTE — Telephone Encounter (Signed)
I will accept him if he is in need of a colonoscopy at this time.  However, he would need an office visit because he is on chronic oral anticoagulation for PE. Before we do that, I need clarification on whether he is really due for a colonoscopy.  Jonathan Rios,  Please review your prior colonoscopy and pathology report and tell me what you think. He had only a hyperplastic polyp removed in 2012, and has no reported family history of colon cancer. Given its location, perhaps you felt it was truly a sessile serrated polyp.   If there is other pertinent information related to this, it may affect his surveillance interval.  Please review your original recommendation and decide if Jonathan Rios is due for a colonoscopy at this time.

## 2018-01-12 NOTE — Telephone Encounter (Signed)
Patient was hoping to be scheduled for May 31st.

## 2018-01-12 NOTE — Telephone Encounter (Signed)
See comments below for scheduling.

## 2018-01-12 NOTE — Telephone Encounter (Signed)
Okay with me 

## 2018-01-12 NOTE — Telephone Encounter (Signed)
Understood. What date did the patient have in mind for procedure?  He needs an OV with me or APP first to handle [place] (Xarelto) issue.

## 2018-01-12 NOTE — Telephone Encounter (Signed)
9 mm sessile polyp with mucus cap (see picture) likely SSP called "hyperplastic". Yes, I believe he is due (overdue) for follow-up.

## 2018-01-12 NOTE — Telephone Encounter (Signed)
Patient is requesting to switch care to Dr. Loletha Carrow. He states he needs to have his procedure on a specific date due to his care partner. Dr. Henrene Pastor doesn't have that day available. He also states he's only been seen once by Dr. Henrene Pastor.

## 2018-01-12 NOTE — Telephone Encounter (Signed)
Dr. Henrene Pastor please see note below and advise if ok with switch.

## 2018-01-12 NOTE — Telephone Encounter (Signed)
Patient is scheduled with an APP on 01-27-18

## 2018-01-12 NOTE — Telephone Encounter (Signed)
I believe that will work, thanks. - HD

## 2018-01-12 NOTE — Telephone Encounter (Signed)
Dr. Loletha Carrow please see notes below. Will you accept this pt? Please advise.

## 2018-01-27 ENCOUNTER — Ambulatory Visit (INDEPENDENT_AMBULATORY_CARE_PROVIDER_SITE_OTHER): Payer: Commercial Managed Care - PPO | Admitting: Physician Assistant

## 2018-01-27 ENCOUNTER — Encounter: Payer: Self-pay | Admitting: Physician Assistant

## 2018-01-27 VITALS — BP 124/84 | HR 84 | Ht 74.0 in | Wt 250.0 lb

## 2018-01-27 DIAGNOSIS — Z86010 Personal history of colonic polyps: Secondary | ICD-10-CM | POA: Diagnosis not present

## 2018-01-27 DIAGNOSIS — Z86711 Personal history of pulmonary embolism: Secondary | ICD-10-CM | POA: Diagnosis not present

## 2018-01-27 DIAGNOSIS — Z7901 Long term (current) use of anticoagulants: Secondary | ICD-10-CM

## 2018-01-27 MED ORDER — NA SULFATE-K SULFATE-MG SULF 17.5-3.13-1.6 GM/177ML PO SOLN: 1.0000 | ORAL | 0 refills | Status: DC

## 2018-01-27 NOTE — Progress Notes (Signed)
Thank you for sending this case to me. I have reviewed the entire note, and the outlined plan seems appropriate.  Thank you for seeing him.  I reviewed his last colonoscopy and pathology reports when the transfer request came to me. Agree with the plan.  Wilfrid Lund, MD

## 2018-01-27 NOTE — Patient Instructions (Signed)

## 2018-01-27 NOTE — Progress Notes (Signed)
Chief Complaint: Preprocedural exam for surveillance colonoscopy on chronic anticoagulation  HPI:    Jonathan Rios is a 62 year old Caucasian male with a past medical history as listed below including PE on Xarelto, who followed with Dr. Henrene Pastor previously for his screening colonoscopy and recently switched care to Dr. Loletha Carrow, who presents clinic today for preprocedural exam due to his chronic anticoagulation.    Last colonoscopy 12/17/10 with Dr. Henrene Pastor with removal of a 9 mm sessile polyp in the ascending colon.  Pathology showed hyperplastic polyp but this was suspected to be a sessile serrated polyp.  Repeat was recommended in 5 years.    Today, explains that he has no real concerns.  He has noticed a change in his bowel form describing this is more of a "crescent shape rather than a circle", but this has been occurring for the past few years.  Patient describes trouble with benign prostatic hypertrophy in the past and treatment for this and wonders if it is due to that, "you know something pushing on the outside".  He denies seeing any blood in his stool or having any diarrhea or constipation.    Denies fever, chills, weight loss, nausea, vomiting, heartburn, reflux or abdominal pain.  Past Medical History:  Diagnosis Date  . Arthritis    "probably a little; finger joints" (02/01/2015)  . BPH (benign prostatic hypertrophy)    "treated for it and it got somewhat better"  . Depression   . Dislocated cervical vertebra   . Family history of adverse reaction to anesthesia    "Mom gets PONV; oldest daughter is not as sensitive to it as dosage would suggest;she  takes more than want they would expect"  . GERD (gastroesophageal reflux disease)    hx  . History of pulmonary embolism   . Hypertension   . 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 Sleep apnea    "not dx'd; family says I have it" (02/01/2015)  . Tinnitus     Past Surgical History:  Procedure Laterality Date  . 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 losartan (COZAAR) 100 MG tablet TAKE 1 TABLET BY MOUTH DAILY 90 tablet 3  . NONFORMULARY OR COMPOUNDED ITEM Graduated Compression Hose Knee High  30-40 mm Hg compression 1 each PRN  . rivaroxaban (XARELTO) 10 MG TABS tablet Take 1 tablet (10 mg total) by mouth daily. 90 tablet 3   No current facility-administered medications for this visit.     Allergies as of 01/27/2018  . (No Known Allergies)    Family History  Problem Relation Age of Onset  . Hypertension Mother   . Diabetes Father   . Leukemia Maternal Grandfather   . Colon cancer Neg Hx   . Liver cancer Neg Hx     Social History   Socioeconomic History  . Marital status: Divorced    Spouse name: Not on file  . Number of children: 3  . Years of education: Not on file  . Highest education level: Not on file  Occupational History  . Not on file  Social Needs  . Financial resource strain: Not on file  . Food insecurity:    Worry: Not on file    Inability: Not on file  . Transportation needs:    Medical: Not on file    Non-medical: Not on file  Tobacco Use  . Smoking status: Never Smoker  . Smokeless  tobacco: Never Used  Substance and Sexual Activity  . Alcohol use: No  . Drug use: No  . Sexual activity: Never  Lifestyle  . Physical activity:    Days per week: Not on file    Minutes per session: Not on file  . Stress: Not on file  Relationships  . Social connections:    Talks on phone: Not on file    Gets together: Not on file    Attends religious service: Not on file    Active member of club or organization: Not on file    Attends meetings of clubs or organizations: Not on file    Relationship status: Not on file  . Intimate partner violence:    Fear of current or ex partner: Not on file    Emotionally abused: Not on file    Physically abused: Not on file    Forced sexual activity: Not on file    Other Topics Concern  . Not on file  Social History Narrative   Lives with daughter.  Works at [employer].      Review of Systems:    Constitutional: No weight loss Skin: No rash  Cardiovascular: No chest pain Respiratory: No SOB Gastrointestinal: See HPI and otherwise negative Genitourinary: No dysuria  Neurological: No headache Musculoskeletal: No new muscle or joint pain Hematologic: No bleeding  Psychiatric: No history of depression or anxiety   Physical Exam:  Vital signs: BP 124/84   Pulse 84   Ht 6\' 2"  (1.88 m)   Wt 250 lb (113.4 kg)   BMI 32.10 kg/m   Constitutional:   Pleasant Caucasian male appears to be in NAD, Well developed, Well nourished, alert and cooperative Respiratory: Respirations even and unlabored. Lungs clear to auscultation bilaterally.   No wheezes, crackles, or rhonchi.  Cardiovascular: Normal S1, S2. No MRG. Regular rate and rhythm. No peripheral edema, cyanosis or pallor.  Gastrointestinal:  Soft, nondistended, nontender. No rebound or guarding. Normal bowel sounds. No appreciable masses or hepatomegaly. Psychiatric: Demonstrates good judgement and reason without abnormal affect or behaviors.  No recent labs or imaging.  Assessment: 1.  History of colon polyps: Last colonoscopy in 2012 with a 9 mm sessile polyp with mucus cap, likely SSP called "hyperplastic", repeat was recommended in 5 years 2.  Chronic anticoagulation: Xarelto for PE 3 years ago 3.  History of pulmonary embolism  Plan: 1.  Scheduled patient with Dr. Loletha Carrow for colonoscopy in [place].  Did discuss risk, benefits, limitations, alternatives and patient agrees to proceed. 2.  Advised the patient to hold his Xarelto for 2 days prior to time of his procedure.  We will communicate with his physician, Dr. Lake Bells to ensure that holding his Xarelto is acceptable for him. 3.  Patient will follow in clinic per recommendations from Dr. Loletha Carrow after time of procedure.  Jonathan Newer, PA-C [facility] 01/27/2018, 3:57 PM

## 2018-02-27 ENCOUNTER — Telehealth: Payer: Self-pay | Admitting: Emergency Medicine

## 2018-02-27 ENCOUNTER — Telehealth: Payer: Self-pay | Admitting: Gastroenterology

## 2018-02-27 NOTE — Telephone Encounter (Signed)
Left patient detailed message that we have resent the anti-coag letter to Dr Lake Bells and we will contact him as soon as we have an answer.  Tinnie Gens, CMA resent letter.

## 2018-02-27 NOTE — Telephone Encounter (Signed)
Pattie, have you heard back from his doctor re: Xarelto? Can you please contact patient, thank you.

## 2018-02-27 NOTE — Telephone Encounter (Signed)
   Jonathan Rios 1956-04-07 [id number]  Dear MV.[email or web address]:  We have scheduled the above named patient for a colonoscopy procedure. Our records show that he is on anticoagulation therapy.  Please advise as to whether the patient may come off their therapy of Xarelto 2 days prior to their procedure which is scheduled for 03-13-18.  Please route your response to Tinnie Gens, CMA or fax response to [phone].  Sincerely,    [facility]

## 2018-03-02 ENCOUNTER — Telehealth: Payer: Self-pay | Admitting: Gastroenterology

## 2018-03-02 NOTE — Telephone Encounter (Signed)
Error

## 2018-03-02 NOTE — Telephone Encounter (Signed)
OK to stop 2 days prior to procedure

## 2018-03-03 ENCOUNTER — Telehealth: Payer: Self-pay | Admitting: Pulmonary Disease

## 2018-03-03 NOTE — Telephone Encounter (Signed)
Pt received information he needed from GI so he wants to cancel the message with [place]...nothing further is needed

## 2018-03-03 NOTE — Telephone Encounter (Signed)
Left message on machine.

## 2018-03-03 NOTE — Telephone Encounter (Signed)
Spoke to patient and informed him to hold Xarelto 2 days before procedure. He verbalized understanding and knows to resume it per discharge instructions.

## 2018-03-13 ENCOUNTER — Ambulatory Visit (AMBULATORY_SURGERY_CENTER): Payer: Commercial Managed Care - PPO | Admitting: Gastroenterology

## 2018-03-13 ENCOUNTER — Other Ambulatory Visit: Payer: Self-pay

## 2018-03-13 ENCOUNTER — Encounter: Payer: Self-pay | Admitting: Gastroenterology

## 2018-03-13 VITALS — BP 109/76 | HR 72 | Temp 98.4°F | Resp 25 | Ht 74.0 in | Wt 250.0 lb

## 2018-03-13 DIAGNOSIS — Z86010 Personal history of colonic polyps: Secondary | ICD-10-CM

## 2018-03-13 DIAGNOSIS — Z1211 Encounter for screening for malignant neoplasm of colon: Secondary | ICD-10-CM

## 2018-03-13 DIAGNOSIS — K5730 Diverticulosis of large intestine without perforation or abscess without bleeding: Secondary | ICD-10-CM

## 2018-03-13 DIAGNOSIS — K640 First degree hemorrhoids: Secondary | ICD-10-CM

## 2018-03-13 MED ORDER — SODIUM CHLORIDE 0.9 % IV SOLN: 500.0000 mL | Freq: Once | INTRAVENOUS | Status: AC

## 2018-03-13 NOTE — Patient Instructions (Signed)
YOU HAD AN ENDOSCOPIC PROCEDURE TODAY AT [facility]:   Refer to the procedure report that was given to you for any specific questions about what was found during the examination.  If the procedure report does not answer your questions, please call your gastroenterologist to clarify.  If you requested that your care partner not be given the details of your procedure findings, then the procedure report has been included in a sealed envelope for you to review at your convenience later.  YOU SHOULD EXPECT: Some feelings of bloating in the abdomen. Passage of more gas than usual.  Walking can help get rid of the air that was put into your GI tract during the procedure and reduce the bloating. If you had a lower endoscopy (such as a colonoscopy or flexible sigmoidoscopy) you may notice spotting of blood in your stool or on the toilet paper. If you underwent a bowel prep for your procedure, you may not have a normal bowel movement for a few days.  Please Note:  You might notice some irritation and congestion in your nose or some drainage.  This is from the oxygen used during your procedure.  There is no need for concern and it should clear up in a day or so.  SYMPTOMS TO REPORT IMMEDIATELY:   Following lower endoscopy (colonoscopy or flexible sigmoidoscopy):  Excessive amounts of blood in the stool  Significant tenderness or worsening of abdominal pains  Swelling of the abdomen that is new, acute  Fever of 100F or higher For urgent or emergent issues, a gastroenterologist can be reached at any hour by calling [phone].   DIET:  We do recommend a small meal at first, but then you may proceed to your regular diet.  Drink plenty of fluids but you should avoid alcoholic beverages for 24 hours.  ACTIVITY:  You should plan to take it easy for the rest of today and you should NOT DRIVE or use heavy machinery until tomorrow (because of the sedation medicines used during the test).     FOLLOW UP: Our staff will call the number listed on your records the next business day following your procedure to check on you and address any questions or concerns that you may have regarding the information given to you following your procedure. If we do not reach you, we will leave a message.  However, if you are feeling well and you are not experiencing any problems, there is no need to return our call.  We will assume that you have returned to your regular daily activities without incident.  If any biopsies were taken you will be contacted by phone or by letter within the next 1-3 weeks.  Please call us at [phone] if you have not heard about the biopsies in 3 weeks.    SIGNATURES/CONFIDENTIALITY: You and/or your care partner have signed paperwork which will be entered into your electronic medical record.  These signatures attest to the fact that that the information above on your After Visit Summary has been reviewed and is understood.  Full responsibility of the confidentiality of this discharge information lies with you and/or your care-partner.  Diverticulosis and hemorrhoid information given.  Resume Xarelto today.

## 2018-03-13 NOTE — Progress Notes (Signed)
To PACU, VSS. Report to [email or web address] 

## 2018-03-13 NOTE — Op Note (Signed)
[facility] Patient Name: Jonathan Rios Procedure Date: 03/13/2018 2:07 PM MRN: [id number] Endoscopist: Farnham. 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 The patient's tolerance of                            previous anesthesia was also reviewed. The risks                            and benefits of the procedure and the sedation                            options and risks were discussed with the patient.                            All questions were answered, and informed consent                            was obtained. Prior Anticoagulants: The patient has                            taken Xarelto (rivaroxaban), last dose was 2 days                            prior to procedure. ASA Grade Assessment: III - A                            patient with severe systemic disease. After                            reviewing the risks and benefits, the patient was  deemed in satisfactory condition to undergo the                            procedure.                           After obtaining informed consent, the colonoscope                            was passed under direct vision. Throughout the                            procedure, the patient's blood pressure, pulse, and                            oxygen saturations were monitored continuously. The                          Colonoscope was introduced through the anus and                            advanced to the the cecum, identified by                            appendiceal orifice and ileocecal valve. The                            colonoscopy was performed without difficulty. The                            patient tolerated the procedure well. The quality                            of the bowel preparation was good. The ileocecal                            valve, appendiceal orifice, and rectum were                            photographed. The quality of the bowel preparation                            was evaluated using the BBPS [facility] Bowel                            Preparation Scale) with scores of: Right Colon = 2,                            Transverse Colon = 2 and Left Colon = 2. The total                            BBPS score equals 6.,after lavage. The bowel  preparation used was SUPREP. Scope In: 2:17:48 PM Scope Out: 2:36:04 PM Scope Withdrawal Time: 0 hours 13 minutes 34 seconds  Total Procedure Duration: 0 hours 18 minutes 16 seconds  Findings:                 The perianal and digital rectal examinations were                            normal.                           A few diverticula were found from sigmoid to                            transverse colon.                           Retroflexion in the rectum was not performed due to                            anatomy.                           Internal hemorrhoids were found. The hemorrhoids                            were Grade I (internal hemorrhoids that do not                            prolapse).                           The exam was otherwise without abnormality. Complications:            No immediate complications. Estimated Blood Loss:     Estimated blood loss: none. Impression:               - Diverticulosis from sigmoid to transverse colon.                           - Internal  hemorrhoids.                           - The examination was otherwise normal.                           - No specimens collected. Recommendation:           - Patient has a contact number available for                            emergencies. The signs and symptoms of potential                            delayed complications were discussed with the                            patient. Return to normal activities tomorrow.  Written discharge instructions were provided to the                            patient.                           - Resume previous diet.                           - Resume Xarelto (rivaroxaban) at prior dose today.                           - Repeat colonoscopy in 10 years for screening                            purposes. Chimamanda Siegfried L. Loletha Carrow, MD 03/13/2018 2:44:59 PM This report has been signed electronically.

## 2018-03-16 ENCOUNTER — Telehealth: Payer: Self-pay

## 2018-03-16 NOTE — Telephone Encounter (Signed)
  Follow up Call-  Call back number 03/13/2018  Post procedure Call Back phone  # [phone]  Permission to leave phone message Yes  Some recent data might be hidden     Patient questions:  Do you have a fever, pain , or abdominal swelling? No. Pain Score  0 *  Have you tolerated food without any problems? Yes.    Have you been able to return to your normal activities? Yes.    Do you have any questions about your discharge instructions: Diet   No. Medications  No. Follow up visit  No.  Do you have questions or concerns about your Care? No.  Actions: * If pain score is 4 or above: No action needed, pain <4.

## 2018-04-22 ENCOUNTER — Telehealth: Payer: Self-pay | Admitting: Gastroenterology

## 2018-04-22 NOTE — Telephone Encounter (Signed)
Matt from [employer] called regarding coding for an ov and colonoscopy. He stated to have spoken 2 weeks ago with Dr. Loletha Carrow' nurse whose name he thought was Jazmine so I am not sure if he really spoke with you.

## 2018-04-22 NOTE — Telephone Encounter (Signed)
Called [employer] back, spoke to another CSR, let her know that I am a triage nurse and if this has to do with billing he needs to contact the billing office. She states that he does have their number in the system note and looks like he is trying to reach them, which is the better choice to get help with billing.

## 2018-08-23 ENCOUNTER — Other Ambulatory Visit: Payer: Self-pay | Admitting: Pulmonary Disease

## 2018-09-08 ENCOUNTER — Telehealth: Payer: Self-pay | Admitting: Pulmonary Disease

## 2018-09-08 NOTE — Telephone Encounter (Signed)
Jonathan Rios is calling back concerning pt clarification on meds. CB# [phone] have sherry paged/kob

## 2018-09-08 NOTE — Telephone Encounter (Signed)
Clarified the last dose of Xarelto 10mg  daily and sherry verified understanding nothing further needed at this time.
